# Patient Record
Sex: Male | Born: 1940 | Race: White | Hispanic: No | Marital: Married | Smoking: Former smoker
Health system: Southern US, Community
[De-identification: ages and names within clinical notes are randomized; demographics above are authoritative.]

## PROBLEM LIST (undated history)

## (undated) DIAGNOSIS — I2510 Atherosclerotic heart disease of native coronary artery without angina pectoris: Secondary | ICD-10-CM

## (undated) DIAGNOSIS — I499 Cardiac arrhythmia, unspecified: Secondary | ICD-10-CM

## (undated) DIAGNOSIS — E119 Type 2 diabetes mellitus without complications: Secondary | ICD-10-CM

## (undated) DIAGNOSIS — Z950 Presence of cardiac pacemaker: Secondary | ICD-10-CM

## (undated) DIAGNOSIS — C801 Malignant (primary) neoplasm, unspecified: Secondary | ICD-10-CM

## (undated) DIAGNOSIS — E785 Hyperlipidemia, unspecified: Secondary | ICD-10-CM

## (undated) DIAGNOSIS — I10 Essential (primary) hypertension: Secondary | ICD-10-CM

## (undated) HISTORY — PX: OTHER SURGICAL HISTORY: SHX169

## (undated) HISTORY — PX: TONSILLECTOMY: SUR1361

## (undated) HISTORY — PX: BACK SURGERY: SHX140

---

## 2011-06-12 DIAGNOSIS — E7800 Pure hypercholesterolemia, unspecified: Secondary | ICD-10-CM | POA: Diagnosis not present

## 2011-06-12 DIAGNOSIS — I10 Essential (primary) hypertension: Secondary | ICD-10-CM | POA: Diagnosis not present

## 2011-06-12 DIAGNOSIS — Z79899 Other long term (current) drug therapy: Secondary | ICD-10-CM | POA: Diagnosis not present

## 2011-06-12 DIAGNOSIS — E119 Type 2 diabetes mellitus without complications: Secondary | ICD-10-CM | POA: Diagnosis not present

## 2011-06-18 DIAGNOSIS — E7800 Pure hypercholesterolemia, unspecified: Secondary | ICD-10-CM | POA: Diagnosis not present

## 2011-06-18 DIAGNOSIS — I10 Essential (primary) hypertension: Secondary | ICD-10-CM | POA: Diagnosis not present

## 2011-06-18 DIAGNOSIS — I2510 Atherosclerotic heart disease of native coronary artery without angina pectoris: Secondary | ICD-10-CM | POA: Diagnosis not present

## 2011-06-18 DIAGNOSIS — E119 Type 2 diabetes mellitus without complications: Secondary | ICD-10-CM | POA: Diagnosis not present

## 2011-08-05 DIAGNOSIS — E119 Type 2 diabetes mellitus without complications: Secondary | ICD-10-CM | POA: Diagnosis not present

## 2011-08-05 DIAGNOSIS — H52229 Regular astigmatism, unspecified eye: Secondary | ICD-10-CM | POA: Diagnosis not present

## 2011-08-05 DIAGNOSIS — H2510 Age-related nuclear cataract, unspecified eye: Secondary | ICD-10-CM | POA: Diagnosis not present

## 2011-08-05 DIAGNOSIS — H5200 Hypermetropia, unspecified eye: Secondary | ICD-10-CM | POA: Diagnosis not present

## 2011-10-03 DIAGNOSIS — E7800 Pure hypercholesterolemia, unspecified: Secondary | ICD-10-CM | POA: Diagnosis not present

## 2011-10-03 DIAGNOSIS — E119 Type 2 diabetes mellitus without complications: Secondary | ICD-10-CM | POA: Diagnosis not present

## 2011-10-03 DIAGNOSIS — M109 Gout, unspecified: Secondary | ICD-10-CM | POA: Diagnosis not present

## 2011-10-10 DIAGNOSIS — E7800 Pure hypercholesterolemia, unspecified: Secondary | ICD-10-CM | POA: Diagnosis not present

## 2011-10-10 DIAGNOSIS — E119 Type 2 diabetes mellitus without complications: Secondary | ICD-10-CM | POA: Diagnosis not present

## 2011-10-10 DIAGNOSIS — I10 Essential (primary) hypertension: Secondary | ICD-10-CM | POA: Diagnosis not present

## 2011-10-20 DIAGNOSIS — D485 Neoplasm of uncertain behavior of skin: Secondary | ICD-10-CM | POA: Diagnosis not present

## 2012-02-05 DIAGNOSIS — E119 Type 2 diabetes mellitus without complications: Secondary | ICD-10-CM | POA: Diagnosis not present

## 2012-02-05 DIAGNOSIS — M109 Gout, unspecified: Secondary | ICD-10-CM | POA: Diagnosis not present

## 2012-02-05 DIAGNOSIS — Z125 Encounter for screening for malignant neoplasm of prostate: Secondary | ICD-10-CM | POA: Diagnosis not present

## 2012-02-05 DIAGNOSIS — E7800 Pure hypercholesterolemia, unspecified: Secondary | ICD-10-CM | POA: Diagnosis not present

## 2012-02-05 DIAGNOSIS — R5383 Other fatigue: Secondary | ICD-10-CM | POA: Diagnosis not present

## 2012-02-09 DIAGNOSIS — IMO0001 Reserved for inherently not codable concepts without codable children: Secondary | ICD-10-CM | POA: Diagnosis not present

## 2012-02-09 DIAGNOSIS — I10 Essential (primary) hypertension: Secondary | ICD-10-CM | POA: Diagnosis not present

## 2012-02-09 DIAGNOSIS — E7800 Pure hypercholesterolemia, unspecified: Secondary | ICD-10-CM | POA: Diagnosis not present

## 2012-02-09 DIAGNOSIS — Z23 Encounter for immunization: Secondary | ICD-10-CM | POA: Diagnosis not present

## 2012-06-11 DIAGNOSIS — E7800 Pure hypercholesterolemia, unspecified: Secondary | ICD-10-CM | POA: Diagnosis not present

## 2012-06-11 DIAGNOSIS — M109 Gout, unspecified: Secondary | ICD-10-CM | POA: Diagnosis not present

## 2012-06-11 DIAGNOSIS — IMO0001 Reserved for inherently not codable concepts without codable children: Secondary | ICD-10-CM | POA: Diagnosis not present

## 2012-06-14 DIAGNOSIS — E7800 Pure hypercholesterolemia, unspecified: Secondary | ICD-10-CM | POA: Diagnosis not present

## 2012-06-14 DIAGNOSIS — I10 Essential (primary) hypertension: Secondary | ICD-10-CM | POA: Diagnosis not present

## 2012-06-14 DIAGNOSIS — E119 Type 2 diabetes mellitus without complications: Secondary | ICD-10-CM | POA: Diagnosis not present

## 2012-07-15 DIAGNOSIS — E7800 Pure hypercholesterolemia, unspecified: Secondary | ICD-10-CM | POA: Diagnosis not present

## 2012-07-26 DIAGNOSIS — H9319 Tinnitus, unspecified ear: Secondary | ICD-10-CM | POA: Diagnosis not present

## 2012-07-26 DIAGNOSIS — H905 Unspecified sensorineural hearing loss: Secondary | ICD-10-CM | POA: Diagnosis not present

## 2012-08-13 DIAGNOSIS — H2510 Age-related nuclear cataract, unspecified eye: Secondary | ICD-10-CM | POA: Diagnosis not present

## 2012-08-13 DIAGNOSIS — H01009 Unspecified blepharitis unspecified eye, unspecified eyelid: Secondary | ICD-10-CM | POA: Diagnosis not present

## 2012-08-13 DIAGNOSIS — H25019 Cortical age-related cataract, unspecified eye: Secondary | ICD-10-CM | POA: Diagnosis not present

## 2012-08-13 DIAGNOSIS — E119 Type 2 diabetes mellitus without complications: Secondary | ICD-10-CM | POA: Diagnosis not present

## 2012-10-27 DIAGNOSIS — E119 Type 2 diabetes mellitus without complications: Secondary | ICD-10-CM | POA: Diagnosis not present

## 2012-10-27 DIAGNOSIS — M109 Gout, unspecified: Secondary | ICD-10-CM | POA: Diagnosis not present

## 2012-10-27 DIAGNOSIS — E7800 Pure hypercholesterolemia, unspecified: Secondary | ICD-10-CM | POA: Diagnosis not present

## 2012-10-29 DIAGNOSIS — E119 Type 2 diabetes mellitus without complications: Secondary | ICD-10-CM | POA: Diagnosis not present

## 2012-10-29 DIAGNOSIS — I10 Essential (primary) hypertension: Secondary | ICD-10-CM | POA: Diagnosis not present

## 2012-10-29 DIAGNOSIS — E7800 Pure hypercholesterolemia, unspecified: Secondary | ICD-10-CM | POA: Diagnosis not present

## 2012-10-29 DIAGNOSIS — M461 Sacroiliitis, not elsewhere classified: Secondary | ICD-10-CM | POA: Diagnosis not present

## 2012-12-28 DIAGNOSIS — M25559 Pain in unspecified hip: Secondary | ICD-10-CM | POA: Diagnosis not present

## 2013-01-17 DIAGNOSIS — M48061 Spinal stenosis, lumbar region without neurogenic claudication: Secondary | ICD-10-CM | POA: Diagnosis not present

## 2013-01-17 DIAGNOSIS — M25559 Pain in unspecified hip: Secondary | ICD-10-CM | POA: Diagnosis not present

## 2013-01-21 DIAGNOSIS — M25559 Pain in unspecified hip: Secondary | ICD-10-CM | POA: Diagnosis not present

## 2013-01-28 DIAGNOSIS — M5137 Other intervertebral disc degeneration, lumbosacral region: Secondary | ICD-10-CM | POA: Diagnosis not present

## 2013-03-02 DIAGNOSIS — E119 Type 2 diabetes mellitus without complications: Secondary | ICD-10-CM | POA: Diagnosis not present

## 2013-03-02 DIAGNOSIS — Z125 Encounter for screening for malignant neoplasm of prostate: Secondary | ICD-10-CM | POA: Diagnosis not present

## 2013-03-02 DIAGNOSIS — E7800 Pure hypercholesterolemia, unspecified: Secondary | ICD-10-CM | POA: Diagnosis not present

## 2013-03-02 DIAGNOSIS — M109 Gout, unspecified: Secondary | ICD-10-CM | POA: Diagnosis not present

## 2013-03-14 DIAGNOSIS — E7800 Pure hypercholesterolemia, unspecified: Secondary | ICD-10-CM | POA: Diagnosis not present

## 2013-03-14 DIAGNOSIS — I10 Essential (primary) hypertension: Secondary | ICD-10-CM | POA: Diagnosis not present

## 2013-03-14 DIAGNOSIS — Z23 Encounter for immunization: Secondary | ICD-10-CM | POA: Diagnosis not present

## 2013-03-14 DIAGNOSIS — E119 Type 2 diabetes mellitus without complications: Secondary | ICD-10-CM | POA: Diagnosis not present

## 2013-03-16 DIAGNOSIS — M5137 Other intervertebral disc degeneration, lumbosacral region: Secondary | ICD-10-CM | POA: Diagnosis not present

## 2013-03-22 DIAGNOSIS — M5137 Other intervertebral disc degeneration, lumbosacral region: Secondary | ICD-10-CM | POA: Diagnosis not present

## 2013-03-29 DIAGNOSIS — M5137 Other intervertebral disc degeneration, lumbosacral region: Secondary | ICD-10-CM | POA: Diagnosis not present

## 2013-06-27 DIAGNOSIS — M5137 Other intervertebral disc degeneration, lumbosacral region: Secondary | ICD-10-CM | POA: Diagnosis not present

## 2013-07-19 DIAGNOSIS — M51379 Other intervertebral disc degeneration, lumbosacral region without mention of lumbar back pain or lower extremity pain: Secondary | ICD-10-CM | POA: Diagnosis not present

## 2013-07-19 DIAGNOSIS — M47817 Spondylosis without myelopathy or radiculopathy, lumbosacral region: Secondary | ICD-10-CM | POA: Diagnosis not present

## 2013-07-19 DIAGNOSIS — M5137 Other intervertebral disc degeneration, lumbosacral region: Secondary | ICD-10-CM | POA: Diagnosis not present

## 2013-07-27 DIAGNOSIS — M109 Gout, unspecified: Secondary | ICD-10-CM | POA: Diagnosis not present

## 2013-07-27 DIAGNOSIS — E119 Type 2 diabetes mellitus without complications: Secondary | ICD-10-CM | POA: Diagnosis not present

## 2013-07-27 DIAGNOSIS — E7800 Pure hypercholesterolemia, unspecified: Secondary | ICD-10-CM | POA: Diagnosis not present

## 2013-07-27 DIAGNOSIS — N183 Chronic kidney disease, stage 3 unspecified: Secondary | ICD-10-CM | POA: Diagnosis not present

## 2013-07-29 DIAGNOSIS — E7800 Pure hypercholesterolemia, unspecified: Secondary | ICD-10-CM | POA: Diagnosis not present

## 2013-07-29 DIAGNOSIS — E119 Type 2 diabetes mellitus without complications: Secondary | ICD-10-CM | POA: Diagnosis not present

## 2013-07-29 DIAGNOSIS — Z1331 Encounter for screening for depression: Secondary | ICD-10-CM | POA: Diagnosis not present

## 2013-07-29 DIAGNOSIS — I10 Essential (primary) hypertension: Secondary | ICD-10-CM | POA: Diagnosis not present

## 2013-07-29 DIAGNOSIS — Z6830 Body mass index (BMI) 30.0-30.9, adult: Secondary | ICD-10-CM | POA: Diagnosis not present

## 2013-07-29 DIAGNOSIS — Z9181 History of falling: Secondary | ICD-10-CM | POA: Diagnosis not present

## 2013-08-02 DIAGNOSIS — M5137 Other intervertebral disc degeneration, lumbosacral region: Secondary | ICD-10-CM | POA: Diagnosis not present

## 2013-08-02 DIAGNOSIS — M48061 Spinal stenosis, lumbar region without neurogenic claudication: Secondary | ICD-10-CM | POA: Diagnosis not present

## 2013-08-03 DIAGNOSIS — I2510 Atherosclerotic heart disease of native coronary artery without angina pectoris: Secondary | ICD-10-CM | POA: Diagnosis not present

## 2013-08-03 DIAGNOSIS — E7800 Pure hypercholesterolemia, unspecified: Secondary | ICD-10-CM | POA: Diagnosis not present

## 2013-08-03 DIAGNOSIS — E119 Type 2 diabetes mellitus without complications: Secondary | ICD-10-CM | POA: Diagnosis not present

## 2013-08-03 DIAGNOSIS — I10 Essential (primary) hypertension: Secondary | ICD-10-CM | POA: Diagnosis not present

## 2013-08-30 DIAGNOSIS — M533 Sacrococcygeal disorders, not elsewhere classified: Secondary | ICD-10-CM | POA: Diagnosis not present

## 2013-09-12 DIAGNOSIS — H5200 Hypermetropia, unspecified eye: Secondary | ICD-10-CM | POA: Diagnosis not present

## 2013-09-12 DIAGNOSIS — H2510 Age-related nuclear cataract, unspecified eye: Secondary | ICD-10-CM | POA: Diagnosis not present

## 2013-09-12 DIAGNOSIS — E119 Type 2 diabetes mellitus without complications: Secondary | ICD-10-CM | POA: Diagnosis not present

## 2013-09-12 DIAGNOSIS — H01009 Unspecified blepharitis unspecified eye, unspecified eyelid: Secondary | ICD-10-CM | POA: Diagnosis not present

## 2013-09-12 DIAGNOSIS — H25019 Cortical age-related cataract, unspecified eye: Secondary | ICD-10-CM | POA: Diagnosis not present

## 2013-09-27 DIAGNOSIS — M48061 Spinal stenosis, lumbar region without neurogenic claudication: Secondary | ICD-10-CM | POA: Diagnosis not present

## 2013-09-27 DIAGNOSIS — M5137 Other intervertebral disc degeneration, lumbosacral region: Secondary | ICD-10-CM | POA: Diagnosis not present

## 2013-11-22 DIAGNOSIS — M109 Gout, unspecified: Secondary | ICD-10-CM | POA: Diagnosis not present

## 2013-11-22 DIAGNOSIS — E119 Type 2 diabetes mellitus without complications: Secondary | ICD-10-CM | POA: Diagnosis not present

## 2013-11-22 DIAGNOSIS — E7800 Pure hypercholesterolemia, unspecified: Secondary | ICD-10-CM | POA: Diagnosis not present

## 2013-11-22 DIAGNOSIS — N183 Chronic kidney disease, stage 3 unspecified: Secondary | ICD-10-CM | POA: Diagnosis not present

## 2013-11-25 DIAGNOSIS — H6120 Impacted cerumen, unspecified ear: Secondary | ICD-10-CM | POA: Diagnosis not present

## 2013-11-25 DIAGNOSIS — E7800 Pure hypercholesterolemia, unspecified: Secondary | ICD-10-CM | POA: Diagnosis not present

## 2013-11-25 DIAGNOSIS — N183 Chronic kidney disease, stage 3 unspecified: Secondary | ICD-10-CM | POA: Diagnosis not present

## 2013-11-25 DIAGNOSIS — I10 Essential (primary) hypertension: Secondary | ICD-10-CM | POA: Diagnosis not present

## 2013-11-25 DIAGNOSIS — E119 Type 2 diabetes mellitus without complications: Secondary | ICD-10-CM | POA: Diagnosis not present

## 2013-11-25 DIAGNOSIS — M109 Gout, unspecified: Secondary | ICD-10-CM | POA: Diagnosis not present

## 2013-11-25 DIAGNOSIS — Z23 Encounter for immunization: Secondary | ICD-10-CM | POA: Diagnosis not present

## 2013-11-25 DIAGNOSIS — Z0000 Encounter for general adult medical examination without abnormal findings: Secondary | ICD-10-CM | POA: Diagnosis not present

## 2013-12-02 DIAGNOSIS — M25559 Pain in unspecified hip: Secondary | ICD-10-CM | POA: Diagnosis not present

## 2013-12-02 DIAGNOSIS — M5137 Other intervertebral disc degeneration, lumbosacral region: Secondary | ICD-10-CM | POA: Diagnosis not present

## 2013-12-02 DIAGNOSIS — M48061 Spinal stenosis, lumbar region without neurogenic claudication: Secondary | ICD-10-CM | POA: Diagnosis not present

## 2014-02-01 DIAGNOSIS — IMO0002 Reserved for concepts with insufficient information to code with codable children: Secondary | ICD-10-CM | POA: Diagnosis not present

## 2014-02-01 DIAGNOSIS — M5137 Other intervertebral disc degeneration, lumbosacral region: Secondary | ICD-10-CM | POA: Diagnosis not present

## 2014-02-01 DIAGNOSIS — M47817 Spondylosis without myelopathy or radiculopathy, lumbosacral region: Secondary | ICD-10-CM | POA: Diagnosis not present

## 2014-02-01 DIAGNOSIS — M545 Low back pain, unspecified: Secondary | ICD-10-CM | POA: Diagnosis not present

## 2014-02-01 DIAGNOSIS — E663 Overweight: Secondary | ICD-10-CM | POA: Diagnosis not present

## 2014-02-21 DIAGNOSIS — M5416 Radiculopathy, lumbar region: Secondary | ICD-10-CM | POA: Diagnosis not present

## 2014-03-13 DIAGNOSIS — M5416 Radiculopathy, lumbar region: Secondary | ICD-10-CM | POA: Diagnosis not present

## 2014-03-28 DIAGNOSIS — M109 Gout, unspecified: Secondary | ICD-10-CM | POA: Diagnosis not present

## 2014-03-28 DIAGNOSIS — E119 Type 2 diabetes mellitus without complications: Secondary | ICD-10-CM | POA: Diagnosis not present

## 2014-03-28 DIAGNOSIS — E780 Pure hypercholesterolemia: Secondary | ICD-10-CM | POA: Diagnosis not present

## 2014-03-28 DIAGNOSIS — Z125 Encounter for screening for malignant neoplasm of prostate: Secondary | ICD-10-CM | POA: Diagnosis not present

## 2014-03-28 DIAGNOSIS — N183 Chronic kidney disease, stage 3 (moderate): Secondary | ICD-10-CM | POA: Diagnosis not present

## 2014-03-29 DIAGNOSIS — L570 Actinic keratosis: Secondary | ICD-10-CM | POA: Diagnosis not present

## 2014-03-29 DIAGNOSIS — D1801 Hemangioma of skin and subcutaneous tissue: Secondary | ICD-10-CM | POA: Diagnosis not present

## 2014-03-29 DIAGNOSIS — C4361 Malignant melanoma of right upper limb, including shoulder: Secondary | ICD-10-CM | POA: Diagnosis not present

## 2014-03-29 DIAGNOSIS — D485 Neoplasm of uncertain behavior of skin: Secondary | ICD-10-CM | POA: Diagnosis not present

## 2014-03-29 DIAGNOSIS — C4359 Malignant melanoma of other part of trunk: Secondary | ICD-10-CM | POA: Diagnosis not present

## 2014-03-29 DIAGNOSIS — D225 Melanocytic nevi of trunk: Secondary | ICD-10-CM | POA: Diagnosis not present

## 2014-03-29 DIAGNOSIS — L814 Other melanin hyperpigmentation: Secondary | ICD-10-CM | POA: Diagnosis not present

## 2014-03-31 DIAGNOSIS — E780 Pure hypercholesterolemia: Secondary | ICD-10-CM | POA: Diagnosis not present

## 2014-03-31 DIAGNOSIS — I10 Essential (primary) hypertension: Secondary | ICD-10-CM | POA: Diagnosis not present

## 2014-03-31 DIAGNOSIS — N183 Chronic kidney disease, stage 3 (moderate): Secondary | ICD-10-CM | POA: Diagnosis not present

## 2014-03-31 DIAGNOSIS — Z23 Encounter for immunization: Secondary | ICD-10-CM | POA: Diagnosis not present

## 2014-03-31 DIAGNOSIS — M109 Gout, unspecified: Secondary | ICD-10-CM | POA: Diagnosis not present

## 2014-03-31 DIAGNOSIS — E119 Type 2 diabetes mellitus without complications: Secondary | ICD-10-CM | POA: Diagnosis not present

## 2014-04-17 DIAGNOSIS — D0361 Melanoma in situ of right upper limb, including shoulder: Secondary | ICD-10-CM | POA: Diagnosis not present

## 2014-04-17 DIAGNOSIS — Z85820 Personal history of malignant melanoma of skin: Secondary | ICD-10-CM | POA: Diagnosis not present

## 2014-04-17 DIAGNOSIS — C4359 Malignant melanoma of other part of trunk: Secondary | ICD-10-CM | POA: Diagnosis not present

## 2014-04-20 DIAGNOSIS — M5136 Other intervertebral disc degeneration, lumbar region: Secondary | ICD-10-CM | POA: Diagnosis not present

## 2014-04-20 DIAGNOSIS — M4716 Other spondylosis with myelopathy, lumbar region: Secondary | ICD-10-CM | POA: Diagnosis not present

## 2014-04-21 ENCOUNTER — Encounter (HOSPITAL_COMMUNITY): Payer: Self-pay | Admitting: Emergency Medicine

## 2014-04-21 ENCOUNTER — Emergency Department (HOSPITAL_COMMUNITY): Payer: Medicare Other

## 2014-04-21 ENCOUNTER — Inpatient Hospital Stay (HOSPITAL_COMMUNITY)
Admission: EM | Admit: 2014-04-21 | Discharge: 2014-04-24 | DRG: 315 | Disposition: A | Discharge status: Home / Self Care | Payer: Medicare Other | Attending: Internal Medicine | Admitting: Internal Medicine

## 2014-04-21 DIAGNOSIS — I10 Essential (primary) hypertension: Secondary | ICD-10-CM | POA: Diagnosis present

## 2014-04-21 DIAGNOSIS — Z9861 Coronary angioplasty status: Secondary | ICD-10-CM | POA: Diagnosis not present

## 2014-04-21 DIAGNOSIS — M546 Pain in thoracic spine: Secondary | ICD-10-CM | POA: Diagnosis not present

## 2014-04-21 DIAGNOSIS — N179 Acute kidney failure, unspecified: Secondary | ICD-10-CM | POA: Diagnosis present

## 2014-04-21 DIAGNOSIS — I2510 Atherosclerotic heart disease of native coronary artery without angina pectoris: Secondary | ICD-10-CM | POA: Diagnosis present

## 2014-04-21 DIAGNOSIS — R9431 Abnormal electrocardiogram [ECG] [EKG]: Secondary | ICD-10-CM | POA: Diagnosis not present

## 2014-04-21 DIAGNOSIS — I4510 Unspecified right bundle-branch block: Secondary | ICD-10-CM | POA: Diagnosis present

## 2014-04-21 DIAGNOSIS — E119 Type 2 diabetes mellitus without complications: Secondary | ICD-10-CM

## 2014-04-21 DIAGNOSIS — I517 Cardiomegaly: Secondary | ICD-10-CM | POA: Diagnosis present

## 2014-04-21 DIAGNOSIS — J9811 Atelectasis: Secondary | ICD-10-CM | POA: Diagnosis present

## 2014-04-21 DIAGNOSIS — R079 Chest pain, unspecified: Secondary | ICD-10-CM | POA: Insufficient documentation

## 2014-04-21 DIAGNOSIS — R072 Precordial pain: Secondary | ICD-10-CM

## 2014-04-21 DIAGNOSIS — I319 Disease of pericardium, unspecified: Principal | ICD-10-CM | POA: Diagnosis present

## 2014-04-21 DIAGNOSIS — E785 Hyperlipidemia, unspecified: Secondary | ICD-10-CM | POA: Diagnosis not present

## 2014-04-21 DIAGNOSIS — I484 Atypical atrial flutter: Secondary | ICD-10-CM | POA: Diagnosis not present

## 2014-04-21 DIAGNOSIS — I4892 Unspecified atrial flutter: Secondary | ICD-10-CM | POA: Diagnosis not present

## 2014-04-21 DIAGNOSIS — Z7982 Long term (current) use of aspirin: Secondary | ICD-10-CM

## 2014-04-21 DIAGNOSIS — I483 Typical atrial flutter: Secondary | ICD-10-CM | POA: Diagnosis not present

## 2014-04-21 DIAGNOSIS — R52 Pain, unspecified: Secondary | ICD-10-CM

## 2014-04-21 DIAGNOSIS — R0789 Other chest pain: Secondary | ICD-10-CM | POA: Diagnosis not present

## 2014-04-21 DIAGNOSIS — Z87891 Personal history of nicotine dependence: Secondary | ICD-10-CM

## 2014-04-21 DIAGNOSIS — M549 Dorsalgia, unspecified: Secondary | ICD-10-CM

## 2014-04-21 HISTORY — DX: Essential (primary) hypertension: I10

## 2014-04-21 HISTORY — DX: Atherosclerotic heart disease of native coronary artery without angina pectoris: I25.10

## 2014-04-21 HISTORY — DX: Hyperlipidemia, unspecified: E78.5

## 2014-04-21 HISTORY — DX: Type 2 diabetes mellitus without complications: E11.9

## 2014-04-21 LAB — LIPID PANEL
Cholesterol: 129 mg/dL (ref 0–200)
HDL: 65 mg/dL (ref >39)
LDL CALC: 56 mg/dL (ref 0–99)
TRIGLYCERIDES: 41 mg/dL (ref <150)
Total CHOL/HDL Ratio: 2 RATIO
VLDL: 8 mg/dL (ref 0–40)

## 2014-04-21 LAB — RAPID URINE DRUG SCREEN, HOSP PERFORMED
Amphetamines: NOT DETECTED
Barbiturates: NOT DETECTED
Benzodiazepines: NOT DETECTED
COCAINE: NOT DETECTED
OPIATES: POSITIVE — AB
Tetrahydrocannabinol: NOT DETECTED

## 2014-04-21 LAB — URINALYSIS, ROUTINE W REFLEX MICROSCOPIC
Bilirubin Urine: NEGATIVE
Glucose, UA: NEGATIVE mg/dL
Hgb urine dipstick: NEGATIVE
Ketones, ur: NEGATIVE mg/dL
Leukocytes, UA: NEGATIVE
NITRITE: NEGATIVE
Protein, ur: NEGATIVE mg/dL
SPECIFIC GRAVITY, URINE: 1.044 — AB (ref 1.005–1.030)
UROBILINOGEN UA: 1 mg/dL (ref 0.0–1.0)
pH: 5 (ref 5.0–8.0)

## 2014-04-21 LAB — BASIC METABOLIC PANEL
ANION GAP: 14 (ref 5–15)
BUN: 26 mg/dL — ABNORMAL HIGH (ref 6–23)
CO2: 24 meq/L (ref 19–32)
CREATININE: 1.09 mg/dL (ref 0.50–1.35)
Calcium: 9.2 mg/dL (ref 8.4–10.5)
Chloride: 99 mEq/L (ref 96–112)
GFR calc Af Amer: 76 mL/min — ABNORMAL LOW (ref >90)
GFR, EST NON AFRICAN AMERICAN: 65 mL/min — AB (ref >90)
Glucose, Bld: 214 mg/dL — ABNORMAL HIGH (ref 70–99)
Potassium: 4.6 mEq/L (ref 3.7–5.3)
Sodium: 137 mEq/L (ref 137–147)

## 2014-04-21 LAB — HEPATIC FUNCTION PANEL
ALT: 13 U/L (ref 0–53)
AST: 14 U/L (ref 0–37)
Albumin: 3.6 g/dL (ref 3.5–5.2)
Alkaline Phosphatase: 53 U/L (ref 39–117)
BILIRUBIN DIRECT: 0.4 mg/dL — AB (ref 0.0–0.3)
BILIRUBIN TOTAL: 1.2 mg/dL (ref 0.3–1.2)
Indirect Bilirubin: 0.8 mg/dL (ref 0.3–0.9)
Total Protein: 6.6 g/dL (ref 6.0–8.3)

## 2014-04-21 LAB — CBC
HCT: 36.1 % — ABNORMAL LOW (ref 39.0–52.0)
Hemoglobin: 11.7 g/dL — ABNORMAL LOW (ref 13.0–17.0)
MCH: 28.8 pg (ref 26.0–34.0)
MCHC: 32.4 g/dL (ref 30.0–36.0)
MCV: 88.9 fL (ref 78.0–100.0)
PLATELETS: 164 10*3/uL (ref 150–400)
RBC: 4.06 MIL/uL — AB (ref 4.22–5.81)
RDW: 15 % (ref 11.5–15.5)
WBC: 7.8 10*3/uL (ref 4.0–10.5)

## 2014-04-21 LAB — MRSA PCR SCREENING: MRSA by PCR: NEGATIVE

## 2014-04-21 LAB — PRO B NATRIURETIC PEPTIDE: PRO B NATRI PEPTIDE: 213.2 pg/mL — AB (ref 0–125)

## 2014-04-21 LAB — D-DIMER, QUANTITATIVE: D-Dimer, Quant: 0.74 ug/mL-FEU — ABNORMAL HIGH (ref 0.00–0.48)

## 2014-04-21 LAB — I-STAT TROPONIN, ED: Troponin i, poc: 0 ng/mL (ref 0.00–0.08)

## 2014-04-21 LAB — TROPONIN I
Troponin I: <0.3 ng/mL (ref <0.30)
Troponin I: <0.3 ng/mL (ref <0.30)
Troponin I: <0.3 ng/mL (ref <0.30)

## 2014-04-21 LAB — HEMOGLOBIN A1C
Hgb A1c MFr Bld: 6.9 % — ABNORMAL HIGH (ref <5.7)
MEAN PLASMA GLUCOSE: 151 mg/dL — AB (ref <117)

## 2014-04-21 LAB — GLUCOSE, CAPILLARY
Glucose-Capillary: 298 mg/dL — ABNORMAL HIGH (ref 70–99)
Glucose-Capillary: 238 mg/dL — ABNORMAL HIGH (ref 70–99)

## 2014-04-21 LAB — MAGNESIUM: Magnesium: 1.4 mg/dL — ABNORMAL LOW (ref 1.5–2.5)

## 2014-04-21 MED ORDER — SODIUM CHLORIDE 0.9 % IJ SOLN
3.0000 mL | Freq: Two times a day (BID) | INTRAMUSCULAR | Status: DC
  Administered 2014-04-22 – 2014-04-24 (×3): 3 mL via INTRAVENOUS

## 2014-04-21 MED ORDER — HYDROMORPHONE HCL 1 MG/ML IJ SOLN
1.0000 mg | Freq: Once | INTRAMUSCULAR | Status: AC
  Administered 2014-04-21: 1 mg via INTRAVENOUS
  Filled 2014-04-21: qty 1

## 2014-04-21 MED ORDER — ENOXAPARIN SODIUM 40 MG/0.4ML ~~LOC~~ SOLN
40.0000 mg | SUBCUTANEOUS | Status: DC
  Administered 2014-04-21 – 2014-04-22 (×2): 40 mg via SUBCUTANEOUS
  Filled 2014-04-21 (×3): qty 0.4

## 2014-04-21 MED ORDER — CARVEDILOL 6.25 MG PO TABS
6.2500 mg | ORAL_TABLET | Freq: Two times a day (BID) | ORAL | Status: DC
  Administered 2014-04-21 – 2014-04-23 (×4): 6.25 mg via ORAL
  Filled 2014-04-21 (×4): qty 1

## 2014-04-21 MED ORDER — NITROGLYCERIN 0.4 MG SL SUBL: 0.4000 mg | SUBLINGUAL_TABLET | SUBLINGUAL | Status: DC | PRN

## 2014-04-21 MED ORDER — MORPHINE SULFATE 2 MG/ML IJ SOLN
1.0000 mg | INTRAMUSCULAR | Status: DC | PRN
Start: 2014-04-21 — End: 2014-04-24
  Administered 2014-04-21: 1 mg via INTRAVENOUS
  Filled 2014-04-21: qty 1

## 2014-04-21 MED ORDER — IOHEXOL 350 MG/ML SOLN
80.0000 mL | Freq: Once | INTRAVENOUS | Status: AC | PRN
  Administered 2014-04-21: 59 mL via INTRAVENOUS

## 2014-04-21 MED ORDER — INSULIN ASPART 100 UNIT/ML ~~LOC~~ SOLN
0.0000 [IU] | Freq: Three times a day (TID) | SUBCUTANEOUS | Status: DC
  Administered 2014-04-21 – 2014-04-23 (×3): 5 [IU] via SUBCUTANEOUS
  Administered 2014-04-23: 2 [IU] via SUBCUTANEOUS
  Administered 2014-04-23: 3 [IU] via SUBCUTANEOUS
  Administered 2014-04-24: 2 [IU] via SUBCUTANEOUS

## 2014-04-21 MED ORDER — OXYCODONE-ACETAMINOPHEN 5-325 MG PO TABS
1.0000 | ORAL_TABLET | ORAL | Status: DC | PRN
  Administered 2014-04-21 – 2014-04-22 (×3): 1 via ORAL
  Filled 2014-04-21 (×3): qty 1

## 2014-04-21 MED ORDER — ACETAMINOPHEN 325 MG PO TABS: 650.0000 mg | ORAL_TABLET | Freq: Four times a day (QID) | ORAL | Status: DC | PRN

## 2014-04-21 MED ORDER — OXYCODONE HCL 5 MG PO TABS
5.0000 mg | ORAL_TABLET | ORAL | Status: DC | PRN
  Administered 2014-04-21 (×2): 5 mg via ORAL
  Filled 2014-04-21 (×2): qty 1

## 2014-04-21 MED ORDER — SIMVASTATIN 40 MG PO TABS
40.0000 mg | ORAL_TABLET | Freq: Every day | ORAL | Status: DC
  Administered 2014-04-21 – 2014-04-23 (×3): 40 mg via ORAL
  Filled 2014-04-21 (×2): qty 1
  Filled 2014-04-21: qty 2

## 2014-04-21 MED ORDER — MORPHINE SULFATE 4 MG/ML IJ SOLN
4.0000 mg | Freq: Once | INTRAMUSCULAR | Status: AC
  Administered 2014-04-21: 4 mg via INTRAVENOUS
  Filled 2014-04-21: qty 1

## 2014-04-21 MED ORDER — OXYCODONE HCL 5 MG PO TABS
5.0000 mg | ORAL_TABLET | ORAL | Status: DC | PRN
Start: 2014-04-21 — End: 2014-04-21

## 2014-04-21 MED ORDER — ASPIRIN EC 81 MG PO TBEC
81.0000 mg | DELAYED_RELEASE_TABLET | Freq: Every day | ORAL | Status: DC
  Administered 2014-04-22 – 2014-04-24 (×3): 81 mg via ORAL
  Filled 2014-04-21 (×4): qty 1

## 2014-04-21 NOTE — ED Notes (Signed)
EMS - Patient coming from home with left sided chest pressure that started this morning around 04:00am.  Patient took 324mg  Aspirin, given 2 nitro with no relief.  Denies SOB, nausea, vomiting.  States the pain is left sided and has "a lot of left shoulder and back pain".  1 stent placed 13 years ago.  132/58, 80 pulse, 14 respirations, 96% room air and 187 CBG.

## 2014-04-21 NOTE — ED Provider Notes (Signed)
CSN: [id number]     Arrival date & time 04/21/14  0609 History   None    Chief Complaint  Patient presents with  . Chest Pain     (Consider location/radiation/quality/duration/timing/severity/associated sxs/prior Treatment) Patient is a 73 y.o. male presenting with chest pain. The history is provided by the patient. No language interpreter was used.  Chest Pain Pain location:  L chest Pain radiates to:  Mid back Pain radiates to the back: yes   Pain severity:  Moderate Onset quality:  Gradual Timing:  Intermittent Progression:  Unchanged Chronicity:  New Context: not breathing   Relieved by:  Nothing Worsened by:  Nothing tried Associated symptoms: back pain   Associated symptoms: no abdominal pain and no nausea     History reviewed. No pertinent past medical history. No past surgical history on file. No family history on file. History  Substance Use Topics  . Smoking status: Not on file  . Smokeless tobacco: Not on file  . Alcohol Use: Not on file    Review of Systems  Cardiovascular: Positive for chest pain.  Gastrointestinal: Negative for nausea and abdominal pain.  Musculoskeletal: Positive for back pain.  All other systems reviewed and are negative.     Allergies  Review of patient's allergies indicates no known allergies.  Home Medications   Prior to Admission medications   Not on File   BP 144/64 mmHg  Pulse 79  Temp(Src) 98.6 F (37 C) (Oral)  Resp 18  Ht 5' 10.5" (1.791 m)  Wt 220 lb (99.791 kg)  BMI 31.11 kg/m2  SpO2 96% Physical Exam  Constitutional: He is oriented to person, place, and time. He appears well-developed and well-nourished.  HENT:  Head: Normocephalic and atraumatic.  Right Ear: External ear normal.  Left Ear: External ear normal.  Eyes: EOM are normal. Pupils are equal, round, and reactive to light.  Neck: Normal range of motion.  Cardiovascular: Normal rate and regular rhythm.   Pulmonary/Chest: Effort normal and  breath sounds normal.  Abdominal: Soft. He exhibits no distension.  Musculoskeletal: Normal range of motion.  Neurological: He is alert and oriented to person, place, and time.  Skin: Skin is warm.  Psychiatric: He has a normal mood and affect.  Nursing note and vitals reviewed.   ED Course  Procedures (including critical care time) Labs Review Labs Reviewed  CBC - Abnormal; Notable for the following:    RBC 4.06 (*)    Hemoglobin 11.7 (*)    HCT 36.1 (*)    All other components within normal limits  BASIC METABOLIC PANEL - Abnormal; Notable for the following:    Glucose, Bld 214 (*)    BUN 26 (*)    GFR calc non Af Amer 65 (*)    GFR calc Af Amer 76 (*)    All other components within normal limits  PRO B NATRIURETIC PEPTIDE - Abnormal; Notable for the following:    Pro B Natriuretic peptide (BNP) 213.2 (*)    All other components within normal limits  D-DIMER, QUANTITATIVE - Abnormal; Notable for the following:    D-Dimer, Quant 0.74 (*)    All other components within normal limits  TROPONIN I  Randolm Idol, ED    Imaging Review Dg Chest 2 View  04/21/2014   CLINICAL DATA:  73 year old male awoke with left upper chest pain. Initial encounter.  EXAM: CHEST  2 VIEW  COMPARISON:  None.  FINDINGS: Mildly low lung volumes. Normal cardiac size and mediastinal  contours. Visualized tracheal air column is within normal limits. No pneumothorax, pulmonary edema, pleural effusion or confluent pulmonary opacity. No acute osseous abnormality identified.  IMPRESSION: No acute cardiopulmonary abnormality.   Electronically Signed   By: Lars Pinks M.D.   On: 04/21/2014 07:30   Ct Angio Chest Pe W/cm &/or Wo Cm  04/21/2014   CLINICAL DATA:  73 year old male with acute left chest pain beginning at 0400 hrs. Pain radiating to the left shoulder and back. 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 Initial encounter.  EXAM: CT ANGIOGRAPHY CHEST WITH CONTRAST  TECHNIQUE: Multidetector CT  imaging of the chest was performed using the standard protocol during bolus administration of intravenous contrast. Multiplanar CT image reconstructions and MIPs were obtained to evaluate the vascular anatomy.  CONTRAST:  72mL OMNIPAQUE IOHEXOL 350 MG/ML SOLN  COMPARISON:  Chest radiographs 0705 hr today.  FINDINGS: Adequate contrast bolus timing in the pulmonary arterial tree. Respiratory motion, especially at the lung bases. No focal filling defect identified in the pulmonary arterial tree to suggest the presence of acute pulmonary embolism.  Major airways are patent, except for some atelectatic changes. Mild linear in dependent opacity in both lung bases most resembles atelectasis.  Mediastinal lipomatosis. Mild to moderate cardiomegaly. No pericardial or pleural effusion. Widespread calcified atherosclerosis of the aorta and its branches, including coronary artery calcified plaque. No thoracic aortic dissection or aneurysm. No mediastinal or hilar lymphadenopathy. No axillary lymphadenopathy. Negative thoracic inlet.  Negative visualized liver, gallbladder, spleen, pancreas, adrenal glands, and bowel in the upper abdomen.  Osteopenia. No acute osseous abnormality identified.  Review of the MIP images confirms the above findings.  IMPRESSION: 1. No evidence of acute pulmonary embolus. 2. No thoracic aortic dissection or aneurysm. Calcified plaque, including calcified coronary artery plaque. 3. Pulmonary atelectasis.  Mild to moderate cardiomegaly.   Electronically Signed   By: Lars Pinks M.D.   On: 04/21/2014 09:29     EKG Interpretation   Date/Time:  Friday April 21 2014 06:21:10 EST Ventricular Rate:  79 PR Interval:  139 QRS Duration: 103 QT Interval:  408 QTC Calculation: 468 R Axis:   -52 Text Interpretation:  Sinus rhythm Incomplete RBBB and LAFB Abnormal  R-wave progression, late transition No old tracing to compare Confirmed by  GOLDSTON  MD, SCOTT ([id number]) on 04/21/2014 8:00:29 AM       MDM   Final diagnoses:  Pain  Pain in back    I spoke to unassigned Medical Service Dr. Tami Ribas.   Pt will be admitted for observation    Fransico Meadow, PA-C 04/21/14 [place], [place] 04/21/14 [place], MD 04/21/14 [phone]

## 2014-04-21 NOTE — H&P (Signed)
Date: 04/21/2014               Patient Name:  Jamie Howard MRN: [id number]  DOB: 1940-07-23 Age / Sex: 73 y.o., male   PCP: Leonides Sake, MD         Medical Service: Internal Medicine Teaching Service         Attending Physician: Dr. Madilyn Fireman, MD    First Contact: Dr.Krall  Pager: [phone]  Second Contact: Dr. Redmond Pulling Pager: [phone]       After Hours (After 5p/  First Contact Pager: [phone]  weekends / holidays): Second Contact Pager: [phone]   Chief Complaint: Chest pain   History of Present Illness:  73 y.o PMH DM, HTN, CAD s/p stent (no cath in the last 13 years).  He presents with left chest pain radiating to his back (left shoulder blade)  without relief with NTG x 2.  Chest pain woke him up at 4 am and felt inward "pressure-like" and like "broken sharp bones sticking into each other".  Nothing made it worse (except for deep breathing) and nothing makes it better. Pain was constant since onset.   Pain was 6-7/10 initially and now 4-5/10 and still present though given Aspirin 324 mg, Tylenol 650 mg x 1, Dilaudid 1 mg x 1, Morphine 4 mg x 1, and NTG in the ED.  He denies nausea/vomiting, sob, sweating, radiation to left arm.  He denies heart burn or anxiety.  This chest pain is a different feeling than 13 years ago when he had a stent.  Now he reports a "crampy" sensation in his neck.  D-dimer was slightly elevated in the ED.  CT chest was done which was negative for PE, aortic dissection but had atelectasis and mild to moderate cardiomegaly.    Cardiologist-Dr. Atilano Median [place] PCP-[facility]  Meds: Current Facility-Administered Medications  Medication Dose Route Frequency Provider Last Rate Last Dose  . acetaminophen (TYLENOL) tablet 650 mg  650 mg Oral Q6H PRN Fransico Meadow, PA-C      . morphine 2 MG/ML injection 1 mg  1 mg Intravenous Q4H PRN Cresenciano Genre, MD   1 mg at 04/21/14 1227  . nitroGLYCERIN (NITROSTAT) SL tablet 0.4 mg  0.4 mg Sublingual Q5 min  PRN Fransico Meadow, PA-C       Medications Prior to Admission  Medication Sig Dispense Refill  . aspirin EC 81 MG tablet Take 81 mg by mouth daily.    . carvedilol (COREG) 25 MG tablet Take 6.25 mg by mouth 2 (two) times daily with a meal.     . colchicine 0.6 MG tablet Take 0.6 mg by mouth daily as needed (gout).    Marland Kitchen doxazosin (CARDURA) 8 MG tablet Take 16 mg by mouth at bedtime.    Marland Kitchen losartan-hydrochlorothiazide (HYZAAR) 100-25 MG per tablet Take 1 tablet by mouth daily.    . metFORMIN (GLUCOPHAGE-XR) 500 MG 24 hr tablet Take 1,000 mg by mouth 2 (two) times daily.    . pioglitazone (ACTOS) 30 MG tablet Take 30 mg by mouth daily.    . simvastatin (ZOCOR) 40 MG tablet Take 40 mg by mouth daily.     Allergies: Allergies as of 04/21/2014  . (No Known Allergies)   Past Medical History  Diagnosis Date  . Diabetes mellitus without complication   . Hypertension   . Low back pain    Past Surgical History  Procedure Laterality Date  . Back surgery  ruptured disk 1970s-1980s  . Other surgical history      stent   Family History  Problem Relation Age of Onset  . Heart attack      dad at 88  . Diabetes      younger brother died complications of DM  . Diabetes      brother 2, DM 2   . Diabetes      aunt type 1  . Leukemia      aunt   History   Social History  . Marital Status: Unknown    Spouse Name: N/A    Number of Children: N/A  . Years of Education: N/A   Occupational History  . Not on file.   Social History Main Topics  . Smoking status: Former Research scientist (life sciences)  . Smokeless tobacco: Not on file  . Alcohol Use: Yes     Comment: occassional  . Drug Use: No  . Sexual Activity: Not on file   Other Topics Concern  . 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     Physical Exam: BP 121/45 (63) 26 78 96% RA  Blood pressure 130/53, pulse 77, temperature 98.6 F (37 C), temperature source Oral, resp. rate 21, height 5' 10.5" (1.791 m), weight 220 lb (99.791 kg), SpO2 96 %. Vitals reviewed. General: resting in bed, NAD HEENT: PERRL b/l, [place]/at, no scleral icterus Cardiac: RRR, no rubs, murmurs or gallops, no chest wall ttp Pulm: clear to auscultation bilaterally, no wheezes, rales, or rhonchi Abd: soft, obese nontender, nondistended, BS present Ext: warm and well perfused, 1+ pedal edema Neuro: alert and oriented X3, cranial nerves II-XII grossly intact, moving all 4 extremities  MSK: mild left shoulder blade ttp   Lab results: Basic Metabolic Panel:  Recent Labs  04/21/14 0635  NA 137  K 4.6  CL 99  CO2 24  GLUCOSE 214*  BUN 26*  CREATININE 1.09  CALCIUM 9.2   Liver Function Tests: No results for input(s): AST, ALT, ALKPHOS, BILITOT, PROT, ALBUMIN in the last 72 hours. No results for input(s): LIPASE, AMYLASE in the last 72 hours. No results for input(s): AMMONIA in the last 72 hours. CBC:  Recent Labs  04/21/14 0635  WBC 7.8  HGB 11.7*  HCT 36.1*  MCV 88.9  PLT 164   Cardiac Enzymes:  Recent Labs  04/21/14 0635  TROPONINI <0.30   BNP:  Recent Labs  04/21/14 0635  PROBNP 213.2*   D-Dimer:  Recent Labs  04/21/14 0635  DDIMER 0.74*   Hemoglobin A1C: No results for input(s): HGBA1C in the last 72 hours. Fasting Lipid Panel: No results for input(s): CHOL, HDL, LDLCALC, TRIG, CHOLHDL, LDLDIRECT in the last 72 hours.  Urine Drug Screen: Drugs of Abuse  No results found for: LABOPIA, COCAINSCRNUR, LABBENZ, AMPHETMU, THCU, LABBARB  Urinalysis: No results for input(s): COLORURINE, LABSPEC,  PHURINE, GLUCOSEU, HGBUR, BILIRUBINUR, KETONESUR, PROTEINUR, UROBILINOGEN, NITRITE, LEUKOCYTESUR in the last 72 hours.  Invalid input(s): APPERANCEUR Misc. Labs: Trop x 2 Mag Lfts  UA UDS Lipid HA1C  Imaging results:  Dg  Chest 2 View  04/21/2014   CLINICAL DATA:  73 year old male awoke with left upper chest pain. Initial encounter.  EXAM: CHEST  2 VIEW  COMPARISON:  None.  FINDINGS: Mildly low lung volumes. Normal cardiac size and mediastinal contours. Visualized tracheal air column is within normal limits. No pneumothorax, pulmonary edema, pleural effusion or confluent pulmonary opacity. No acute osseous abnormality identified.  IMPRESSION: No acute cardiopulmonary abnormality.   Electronically Signed   By: Lars Pinks M.D.   On: 04/21/2014 07:30   Ct Angio Chest Pe W/cm &/or Wo Cm  04/21/2014   CLINICAL DATA:  73 year old male with acute left chest pain beginning at 0400 hrs. Pain radiating to the left shoulder and back. 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 Initial encounter.  EXAM: CT ANGIOGRAPHY CHEST WITH CONTRAST  TECHNIQUE: Multidetector CT imaging of the chest was performed using the standard protocol during bolus administration of intravenous contrast. Multiplanar CT image reconstructions and MIPs were obtained to evaluate the vascular anatomy.  CONTRAST:  2mL OMNIPAQUE IOHEXOL 350 MG/ML SOLN  COMPARISON:  Chest radiographs 0705 hr today.  FINDINGS: Adequate contrast bolus timing in the pulmonary arterial tree. Respiratory motion, especially at the lung bases. No focal filling defect identified in the pulmonary arterial tree to suggest the presence of acute pulmonary embolism.  Major airways are patent, except for some atelectatic changes. Mild linear in dependent opacity in both lung bases most resembles atelectasis.  Mediastinal lipomatosis. Mild to moderate cardiomegaly. No pericardial or pleural effusion. Widespread calcified atherosclerosis of the aorta and its branches,  including coronary artery calcified plaque. No thoracic aortic dissection or aneurysm. No mediastinal or hilar lymphadenopathy. No axillary lymphadenopathy. Negative thoracic inlet.  Negative visualized liver, gallbladder, spleen, pancreas, adrenal glands, and bowel in the upper abdomen.  Osteopenia. No acute osseous abnormality identified.  Review of the MIP images confirms the above findings.  IMPRESSION: 1. No evidence of acute pulmonary embolus. 2. No thoracic aortic dissection or aneurysm. Calcified plaque, including calcified coronary artery plaque. 3. Pulmonary atelectasis.  Mild to moderate cardiomegaly.   Electronically Signed   By: Lars Pinks M.D.   On: 04/21/2014 09:29    Other results: EKG: rate 80, LAD, nl intervals, no LVH, artifact, no acute ST/T changes, pending repeat EKG  Assessment & Plan by Problem: 73 y.o h/o DM, HTN, CAD s/p stent presenting for chest pain  #Chest pain -Trend troponin x 2. With h/o CAD concerned for angina. TIMI score 3-4.  Other ddx include pericarditis as CP is pleuritic with inspiration on exam though does not change when leans forward going against typical features of pericarditis. CT neg for PE or aortic dissection -continue Aspirin 81 mg, NTG prn  -continued Coreg 6.25 mg bid, statin -admit to tele for obs  -will consult cardiology -pending HA1C, lipid, UDS, Trop x 2 -consider echo -will need outpatient f/u with PCP in [place] [employer] (Dr. Daiva Eves) and cardiology in [facility] [place] (Dr. Cheri Fowler)  #HTN (hypertension) -continued Coreg, held Cardura 16 mg qhs and Hyzaar 100-25 mg as BP was soft on admission -can re add home BP meds if needed   #DM type 2 (diabetes mellitus, type 2) -SSI-S. Held home medications (Metformin and Actos) -monitor cbgs   #F/E/N -NSL -BMET in am -H/H diet   #DVT px  -Lov, scds   Dispo: Disposition is deferred at this time, awaiting improvement of current medical problems. Anticipated discharge in approximately 1  day(s).   The patient does have a current  PCP in [place]  and does not need an [facility] hospital follow-up appointment after discharge.  The patient does not have transportation limitations that hinder transportation to clinic appointments.  Signed: Cresenciano Genre, MD [phone] 04/21/2014, 12:55 PM

## 2014-04-21 NOTE — Consult Note (Signed)
CARDIOLOGY CONSULT NOTE   Patient ID: Jamie Howard MRN: [id number] DOB/AGE: 12/21/1940 73 y.o.  Admit date: 04/21/2014  Primary Physician   Leonides Sake, MD Primary Cardiologist  Dr. Bud [place] Reason for Consultation   Chest pain   HPI:  Jamie Howard is a 73 y.o. male with a history of gout, obesity, DM, HTN, HLD, and CAD s/p remote stenting who presented to [facility] today with chest pain.   He is followed by Dr. Atilano Median in [facility] for CAD. He was initially seen about 13 years ago for evaluation of exertional chest pain. He underwent stress testing at that time and was referred for cardiac cath with subsequent stenting. Since that time he has had no issues and has been in his usual state of health. He works out 3-4 times a week with an exercise program and has had no recent exertional chest pain or shortness of breath. This morning he was woken up with acute left-sided shoulder pain that radiated through his left chest. He had no associated shortness of breath, nausea or diaphoresis. It is worse with deep inspiration and feels like a stabbing pain. It is not better or worse with any certain position. At its worst the pain was a 7/10 and is now down to a 4/10 after Aspirin 324 mg, Tylenol 650 mg x 1, Dilaudid 1 mg x 1, Morphine 4 mg x 1, and NTG in the ED.  He was initially giving SL nitroglycerin by EMS with no relief. This chest/back pain is not reminiscent of his prior anginal pain prior to stenting which was described as electric shocks running across his chest with mowing the lawn. He takes all of his medicines as prescribed and follows with his cardiologist yearly. His last stress test was several years ago and reportedly normal. D-dimer was slightly elevated in the ED, however CT chest was negative for PE and aortic dissection.   Past Medical History  Diagnosis Date  . Diabetes mellitus without complication   . Hypertension   . CAD (coronary artery disease)      a. s/p remote stent ~2002  . HLD (hyperlipidemia)      Past Surgical History  Procedure Laterality Date  . Back surgery      ruptured disk 1970s-1980s  . Other surgical history      stent    No Known Allergies  I have reviewed the patient's current medications . [START ON 04/22/2014] aspirin EC  81 mg Oral Daily  . carvedilol  6.25 mg Oral BID WC  . enoxaparin (LOVENOX) injection  40 mg Subcutaneous Q24H  . insulin aspart  0-9 Units Subcutaneous TID WC  . simvastatin  40 mg Oral q1800  . sodium chloride  3 mL Intravenous Q12H     acetaminophen, morphine injection, nitroGLYCERIN  Prior to Admission medications   Medication Sig Start Date End Date Taking? Authorizing Provider  aspirin EC 81 MG tablet Take 81 mg by mouth daily.   Yes Historical Provider, MD  carvedilol (COREG) 25 MG tablet Take 6.25 mg by mouth 2 (two) times daily with a meal.    Yes Historical Provider, MD  colchicine 0.6 MG tablet Take 0.6 mg by mouth daily as needed (gout).   Yes Historical Provider, MD  doxazosin (CARDURA) 8 MG tablet Take 16 mg by mouth at bedtime.   Yes Historical Provider, MD  losartan-hydrochlorothiazide (HYZAAR) 100-25 MG per tablet Take 1 tablet by mouth daily.  Yes Historical Provider, MD  metFORMIN (GLUCOPHAGE-XR) 500 MG 24 hr tablet Take 1,000 mg by mouth 2 (two) times daily.   Yes Historical Provider, MD  pioglitazone (ACTOS) 30 MG tablet Take 30 mg by mouth daily.   Yes Historical Provider, MD  simvastatin (ZOCOR) 40 MG tablet Take 40 mg by mouth daily.   Yes Historical Provider, MD     History   Social History  . Marital Status: Unknown    Spouse Name: N/A    Number of Children: N/A  . Years of Education: N/A   Occupational History  . Not on file.   Social History Main Topics  . Smoking status: Former Research scientist (life sciences)  . Smokeless tobacco: Not on file  . Alcohol Use: Yes     Comment: occassional  . Drug Use: No  . Sexual Activity: Not on file   Other Topics Concern  .  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   Family History  Problem Relation Age of Onset  . Heart attack      dad at 53  . Diabetes      younger brother died complications of DM  . Diabetes      brother 2, DM 2   . Diabetes      aunt type 1  . Leukemia      aunt     ROS:  Full 14 point review of systems complete and found to be negative unless listed above.  Physical Exam: Blood pressure 113/51, pulse 81, temperature 99.4 F (37.4 C), temperature source Oral, resp. rate 19, height 5' 10.5" (1.791 m), weight 220 lb (99.791 kg), SpO2 96 %.  General: resting in bed, NAD HEENT: PERRL b/l, [place]/at, no scleral icterus Cardiac: RRR, no rubs, murmurs or gallops, no chest wall ttp Pulm: clear to auscultation bilaterally, no wheezes, rales, or rhonchi Abd: soft, obese nontender, nondistended, BS present Ext: warm and well perfused, Trace pedal edema Neuro: alert and oriented X3, cranial nerves II-XII grossly intact, moving all 4 extremities  MSK: mild left shoulder blade ttp   Labs:   Lab Results  Component Value Date   WBC 7.8 04/21/2014   HGB 11.7* 04/21/2014   HCT 36.1* 04/21/2014   MCV 88.9 04/21/2014   PLT 164 04/21/2014   No results for input(s): INR in the last 72 hours.   Recent Labs Lab 04/21/14 0635 04/21/14 1310  NA 137  --   K 4.6  --   CL 99  --   CO2 24  --   BUN 26*  --   CREATININE 1.09  --   CALCIUM 9.2  --   PROT  --  6.6  BILITOT  --  1.2  ALKPHOS  --  53  ALT  --  13  AST  --  14  GLUCOSE 214*  --   ALBUMIN  --  3.6   MAGNESIUM  Date Value Ref Range Status  04/21/2014 1.4* 1.5 - 2.5 mg/dL Final    Recent Labs  04/21/14 0635 04/21/14 1310  TROPONINI <0.30 <0.30    Recent Labs  04/21/14 0644  TROPIPOC 0.00   PRO B NATRIURETIC PEPTIDE (BNP)  Date/Time Value Ref Range Status    04/21/2014 06:35 AM 213.2* 0 - 125 pg/mL Final  Lab Results  Component Value Date   CHOL 129 04/21/2014   HDL 65 04/21/2014   LDLCALC 56 04/21/2014   TRIG 41 04/21/2014   Lab Results  Component Value Date   DDIMER 0.74* 04/21/2014    Echo: none  ECG:  Sinus rhythm Incomplete RBBB and LAFB Abnormal R-wave progression, late transition Slight ST elevation inferior leads  Radiology:  Dg Chest 2 View  04/21/2014   CLINICAL DATA:  73 year old male awoke with left upper chest pain. Initial encounter.  EXAM: CHEST  2 VIEW  COMPARISON:  None.  FINDINGS: Mildly low lung volumes. Normal cardiac size and mediastinal contours. Visualized tracheal air column is within normal limits. No pneumothorax, pulmonary edema, pleural effusion or confluent pulmonary opacity. No acute osseous abnormality identified.  IMPRESSION: No acute cardiopulmonary abnormality.   Electronically Signed   By: Lars Pinks M.D.   On: 04/21/2014 07:30   Ct Angio Chest Pe W/cm &/or Wo Cm  04/21/2014   CLINICAL DATA:  73 year old male with acute left chest pain beginning at 0400 hrs. Pain radiating to the left shoulder and back. 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 Initial encounter.  EXAM: CT ANGIOGRAPHY CHEST WITH CONTRAST  TECHNIQUE: Multidetector CT imaging of the chest was performed using the standard protocol during bolus administration of intravenous contrast. Multiplanar CT image reconstructions and MIPs were obtained to evaluate the vascular anatomy.  CONTRAST:  2mL OMNIPAQUE IOHEXOL 350 MG/ML SOLN  COMPARISON:  Chest radiographs 0705 hr today.  FINDINGS: Adequate contrast bolus timing in the pulmonary arterial tree. Respiratory motion, especially at the lung bases. No focal filling defect identified in the pulmonary arterial tree to suggest the presence of acute pulmonary embolism.  Major airways are patent, except for some atelectatic changes. Mild linear in dependent opacity in both lung bases most resembles  atelectasis.  Mediastinal lipomatosis. Mild to moderate cardiomegaly. No pericardial or pleural effusion. Widespread calcified atherosclerosis of the aorta and its branches, including coronary artery calcified plaque. No thoracic aortic dissection or aneurysm. No mediastinal or hilar lymphadenopathy. No axillary lymphadenopathy. Negative thoracic inlet.  Negative visualized liver, gallbladder, spleen, pancreas, adrenal glands, and bowel in the upper abdomen.  Osteopenia. No acute osseous abnormality identified.  Review of the MIP images confirms the above findings.  IMPRESSION: 1. No evidence of acute pulmonary embolus. 2. No thoracic aortic dissection or aneurysm. Calcified plaque, including calcified coronary artery plaque. 3. Pulmonary atelectasis.  Mild to moderate cardiomegaly.   Electronically Signed   By: Lars Pinks M.D.   On: 04/21/2014 09:29    ASSESSMENT AND PLAN:    Principal Problem:   Chest pain Active Problems:   DM type 2 (diabetes mellitus, type 2)   CAD (coronary artery disease)   HTN (hypertension)   HLD (hyperlipidemia)   Hypertension   Diabetes mellitus without complication  Pedro Whiters is a 73 y.o. male with a history of gout, obesity, DM, HTN, HLD, and CAD s/p remote stenting who presented to [facility] today with chest pain.   Chest pain- atypical chest pain, but he does have known CAD and continued RFs.  -- Troponin negative 2. ECG with no ST or TW changes  -- Consider ETT myoview in the AM. No heparin unless troponin returns positive. -- Will give some percocet for back pain.   HLD- LDL 56. Continue statin  HTN- BP well controlled currently on carvedilol -- Home Hyzaar and doxazosin being held  DM- SSI per IM  D-dimer slightly elevated in the ED,  however CT chest was negative for PE and aortic dissection.   SignedCrista Luria 04/21/2014 4:51 PM  Pager [phone]  Co-Sign MD  Patient seen and examined with Nell Range, PA-C. We  discussed all aspects of the encounter. I agree with the assessment and plan as stated above.  CP very atypical and now more in his back. Not reproducible with palpation. ECG and CE are negative. Pain not reminiscent of previous angina.  I have also reviewed CT images. No PE. Aorta well opacified and no dissection.  Will plan ETT/myoview in am.   Benay Spice 5:34 PM

## 2014-04-22 ENCOUNTER — Observation Stay (HOSPITAL_COMMUNITY): Payer: Medicare Other

## 2014-04-22 DIAGNOSIS — E785 Hyperlipidemia, unspecified: Secondary | ICD-10-CM | POA: Diagnosis not present

## 2014-04-22 DIAGNOSIS — R0789 Other chest pain: Secondary | ICD-10-CM | POA: Diagnosis not present

## 2014-04-22 DIAGNOSIS — R079 Chest pain, unspecified: Secondary | ICD-10-CM

## 2014-04-22 DIAGNOSIS — N179 Acute kidney failure, unspecified: Secondary | ICD-10-CM

## 2014-04-22 DIAGNOSIS — E119 Type 2 diabetes mellitus without complications: Secondary | ICD-10-CM | POA: Diagnosis not present

## 2014-04-22 DIAGNOSIS — Z7982 Long term (current) use of aspirin: Secondary | ICD-10-CM | POA: Diagnosis not present

## 2014-04-22 DIAGNOSIS — Z9861 Coronary angioplasty status: Secondary | ICD-10-CM | POA: Diagnosis not present

## 2014-04-22 DIAGNOSIS — Z87891 Personal history of nicotine dependence: Secondary | ICD-10-CM | POA: Diagnosis not present

## 2014-04-22 DIAGNOSIS — I4510 Unspecified right bundle-branch block: Secondary | ICD-10-CM | POA: Diagnosis present

## 2014-04-22 DIAGNOSIS — J9811 Atelectasis: Secondary | ICD-10-CM | POA: Diagnosis present

## 2014-04-22 DIAGNOSIS — I517 Cardiomegaly: Secondary | ICD-10-CM | POA: Diagnosis present

## 2014-04-22 DIAGNOSIS — I10 Essential (primary) hypertension: Secondary | ICD-10-CM | POA: Diagnosis not present

## 2014-04-22 DIAGNOSIS — I319 Disease of pericardium, unspecified: Secondary | ICD-10-CM | POA: Diagnosis not present

## 2014-04-22 DIAGNOSIS — R52 Pain, unspecified: Secondary | ICD-10-CM | POA: Insufficient documentation

## 2014-04-22 DIAGNOSIS — R072 Precordial pain: Secondary | ICD-10-CM | POA: Diagnosis not present

## 2014-04-22 DIAGNOSIS — I484 Atypical atrial flutter: Secondary | ICD-10-CM | POA: Diagnosis not present

## 2014-04-22 DIAGNOSIS — I2510 Atherosclerotic heart disease of native coronary artery without angina pectoris: Secondary | ICD-10-CM | POA: Diagnosis not present

## 2014-04-22 DIAGNOSIS — I483 Typical atrial flutter: Secondary | ICD-10-CM | POA: Diagnosis not present

## 2014-04-22 DIAGNOSIS — I4892 Unspecified atrial flutter: Secondary | ICD-10-CM | POA: Diagnosis not present

## 2014-04-22 LAB — TROPONIN I

## 2014-04-22 LAB — BASIC METABOLIC PANEL
Anion gap: 20 — ABNORMAL HIGH (ref 5–15)
BUN: 36 mg/dL — AB (ref 6–23)
CALCIUM: 9.2 mg/dL (ref 8.4–10.5)
CO2: 18 meq/L — AB (ref 19–32)
CREATININE: 2.43 mg/dL — AB (ref 0.50–1.35)
Chloride: 98 mEq/L (ref 96–112)
GFR calc Af Amer: 29 mL/min — ABNORMAL LOW (ref >90)
GFR, EST NON AFRICAN AMERICAN: 25 mL/min — AB (ref >90)
Glucose, Bld: 219 mg/dL — ABNORMAL HIGH (ref 70–99)
Potassium: 5 mEq/L (ref 3.7–5.3)
SODIUM: 136 meq/L — AB (ref 137–147)

## 2014-04-22 LAB — GLUCOSE, CAPILLARY
Glucose-Capillary: 185 mg/dL — ABNORMAL HIGH (ref 70–99)
Glucose-Capillary: 276 mg/dL — ABNORMAL HIGH (ref 70–99)
Glucose-Capillary: 206 mg/dL — ABNORMAL HIGH (ref 70–99)

## 2014-04-22 MED ORDER — REGADENOSON 0.4 MG/5ML IV SOLN
INTRAVENOUS | Status: AC
  Administered 2014-04-22: 0.4 mg via INTRAVENOUS
  Filled 2014-04-22: qty 5

## 2014-04-22 MED ORDER — FLUOROURACIL 5 % EX CREA
TOPICAL_CREAM | Freq: Two times a day (BID) | CUTANEOUS | Status: DC
  Administered 2014-04-22 – 2014-04-23 (×3): via TOPICAL

## 2014-04-22 MED ORDER — TECHNETIUM TC 99M SESTAMIBI GENERIC - CARDIOLITE
10.0000 | Freq: Once | INTRAVENOUS | Status: AC | PRN
  Administered 2014-04-22: 10 via INTRAVENOUS

## 2014-04-22 MED ORDER — SODIUM CHLORIDE 0.9 % IV SOLN
INTRAVENOUS | Status: DC
  Administered 2014-04-22 – 2014-04-23 (×2): via INTRAVENOUS

## 2014-04-22 MED ORDER — REGADENOSON 0.4 MG/5ML IV SOLN
0.4000 mg | Freq: Once | INTRAVENOUS | Status: AC
  Administered 2014-04-22: 0.4 mg via INTRAVENOUS
  Filled 2014-04-22: qty 5

## 2014-04-22 MED ORDER — TECHNETIUM TC 99M SESTAMIBI - CARDIOLITE
30.0000 | Freq: Once | INTRAVENOUS | Status: AC | PRN
  Administered 2014-04-22: 30 via INTRAVENOUS

## 2014-04-22 MED ORDER — MUPIROCIN CALCIUM 2 % EX CREA
TOPICAL_CREAM | Freq: Every day | CUTANEOUS | Status: DC
  Administered 2014-04-22: 12:00:00 via TOPICAL
  Filled 2014-04-22: qty 15

## 2014-04-22 NOTE — Progress Notes (Signed)
Reviewed recent EKGs from today 1345 and 1448 which show ST elevation in inferior leads and flutter rhythm. Discussed with IM team. Discussed the EKGs with Cards PA on call. Residents examined the patient and report unchanged exam from before. The intensity of CP has not increased. Agree with resident plan to keep the patient on observation here another 24 hours, trend troponins and EKGs and get ESR and CRP levels.

## 2014-04-22 NOTE — Progress Notes (Signed)
Subjective: With intermittent positional chest pain. Pain also worsen with deep breathing and cough.   Objective: Vital signs in last 24 hours: Temp:  [98.6 F (37 C)-99.4 F (37.4 C)] 98.6 F (37 C) (12/12 0555) Pulse Rate:  [65-91] 86 (12/12 0918) Resp:  [19-23] 19 (12/11 1305) BP: (94-130)/(39-53) 118/45 mmHg (12/12 0918) SpO2:  [90 %-97 %] 95 % (12/12 0555) Weight:  [219 lb (99.338 kg)] 219 lb (99.338 kg) (12/12 0555) Last BM Date: 04/20/14  Intake/Output from previous day: 12/11 0701 - 12/12 0700 In: 240 [P.O.:240] Out: -  Intake/Output this shift:    Medications Current Facility-Administered Medications  Medication Dose Route Frequency Provider Last Rate Last Dose  . 0.9 %  sodium chloride infusion   Intravenous Continuous Francesca Oman, DO      . acetaminophen (TYLENOL) tablet 650 mg  650 mg Oral Q6H PRN Fransico Meadow, PA-C      . aspirin EC tablet 81 mg  81 mg Oral Daily Cresenciano Genre, MD      . carvedilol (COREG) tablet 6.25 mg  6.25 mg Oral BID WC Cresenciano Genre, MD   6.25 mg at 04/21/14 1728  . enoxaparin (LOVENOX) injection 40 mg  40 mg Subcutaneous Q24H Cresenciano Genre, MD   40 mg at 04/21/14 1400  . insulin aspart (novoLOG) injection 0-9 Units  0-9 Units Subcutaneous TID WC Cresenciano Genre, MD   5 Units at 04/21/14 1742  . morphine 2 MG/ML injection 1 mg  1 mg Intravenous Q4H PRN Cresenciano Genre, MD   1 mg at 04/21/14 1227  . nitroGLYCERIN (NITROSTAT) SL tablet 0.4 mg  0.4 mg Sublingual Q5 min PRN Fransico Meadow, PA-C      . oxyCODONE-acetaminophen (PERCOCET/ROXICET) 5-325 MG per tablet 1 tablet  1 tablet Oral Q3H PRN Eileen Stanford, PA-C   1 tablet at 04/21/14 2046   And  . oxyCODONE (Oxy IR/ROXICODONE) immediate release tablet 5 mg  5 mg Oral Q3H PRN Eileen Stanford, PA-C   5 mg at 04/21/14 2046  . simvastatin (ZOCOR) tablet 40 mg  40 mg Oral q1800 Cresenciano Genre, MD   40 mg at 04/21/14 1728  . sodium chloride 0.9 % injection 3 mL  3 mL Intravenous  Q12H Cresenciano Genre, MD      . technetium sestamibi (CARDIOLITE) injection [place] Intravenous Once PRN Medication Radiologist, MD        PE: General appearance: alert, cooperative and no distress Lungs: clear to auscultation bilaterally Heart: regular rate and rhythm, S1, S2 normal, no murmur, click, rub or gallop Extremities: no LEE Pulses: 2+ and symmetric Skin: warm and dry Neurologic: Grossly normal  Lab Results:   Recent Labs  04/21/14 0635  WBC 7.8  HGB 11.7*  HCT 36.1*  PLT 164   BMET  Recent Labs  04/21/14 0635 04/22/14 0347  NA 137 136*  K 4.6 5.0  CL 99 98  CO2 24 18*  GLUCOSE 214* 219*  BUN 26* 36*  CREATININE 1.09 2.43*  CALCIUM 9.2 9.2   PT/INR No results for input(s): LABPROT, INR in the last 72 hours. Cholesterol  Recent Labs  04/21/14 1310  CHOL 129   Cardiac Panel (last 3 results)  Recent Labs  04/21/14 0635 04/21/14 1310 04/21/14 1840  TROPONINI <0.30 <0.30 <0.30     Assessment/Plan    Principal Problem:   Chest pain Active Problems:  DM type 2 (diabetes mellitus, type 2)   CAD (coronary artery disease)   HTN (hypertension)   HLD (hyperlipidemia)   Hypertension   Diabetes mellitus without complication  1. Atypical CP: cardiac enzymes negative x 3. Lexiscan NST completed today. No EKG changes. Radiologist interpretation to follow. Can d/c home today if normal study. If abnormal, will plan for [facility] on Monday.     LOS: 1 day    Brittainy M. Rosita Fire, PA-C 04/22/2014 9:54 AM  I have seen, examined the patient, and reviewed the above assessment and plan.  Changes to above are made where necessary.  If myoview is low risk, would anticipate discharge later today.  IF high risk, further CV tesing may be required.  Cardiology to see as needed Please call with questions.  Co Sign: Thompson Grayer, MD 04/22/2014 10:03 AM

## 2014-04-22 NOTE — Progress Notes (Signed)
Subjective: Jamie Howard. Doing well this morning. Reports having the chest soreness still. Denies shortness of breath, N/V, abdominal pain.  Objective: Vital signs in last 24 hours: Filed Vitals:   04/22/14 0912 04/22/14 0914 04/22/14 0916 04/22/14 0918  BP: 126/48 108/44 125/48 118/45  Pulse: 91 85 87 86  Temp:      TempSrc:      Resp:      Height:      Weight:      SpO2:       Weight change: -1 lb (-0.454 kg)  Intake/Output Summary (Last 24 hours) at 04/22/14 1305 Last data filed at 04/22/14 0900  Gross per 24 hour  Intake    240 ml  Output      0 ml  Net    240 ml   General: NAD HEENT: PERRL b/l, [place]/at, no scleral icterus Cardiac: RRR, no rubs, murmurs or gallops, no chest wall ttp Pulm: clear to auscultation bilaterally, no wheezes, rales, or rhonchi Abd: soft, nontender, nondistended, BS present Ext: warm and well perfused, 1+ pedal edema Neuro: alert and oriented X3, cranial nerves II-XII grossly intact, moving all 4 extremities  MSK: mild left shoulder blade ttp, R shoulder incision c/d/i, no erythema or drainage, appropriately tender to palpation  Lab Results: Basic Metabolic Panel:  Recent Labs Lab 04/21/14 0635 04/21/14 1310 04/22/14 0347  NA 137  --  136*  K 4.6  --  5.0  CL 99  --  98  CO2 24  --  18*  GLUCOSE 214*  --  219*  BUN 26*  --  36*  CREATININE 1.09  --  2.43*  CALCIUM 9.2  --  9.2  MG  --  1.4*  --    Liver Function Tests:  Recent Labs Lab 04/21/14 1310  AST 14  ALT 13  ALKPHOS 53  BILITOT 1.2  PROT 6.6  ALBUMIN 3.6   CBC:  Recent Labs Lab 04/21/14 0635  WBC 7.8  HGB 11.7*  HCT 36.1*  MCV 88.9  PLT 164   Cardiac Enzymes:  Recent Labs Lab 04/21/14 0635 04/21/14 1310 04/21/14 1840  TROPONINI <0.30 <0.30 <0.30   BNP:  Recent Labs Lab 04/21/14 0635  PROBNP 213.2*   D-Dimer:  Recent Labs Lab 04/21/14 0635  DDIMER 0.74*   CBG:  Recent Labs Lab 04/21/14 1621 04/21/14 2133 04/22/14 1039  GLUCAP 298*  238* 185*   Hemoglobin A1C:  Recent Labs Lab 04/21/14 1310  HGBA1C 6.9*   Fasting Lipid Panel:  Recent Labs Lab 04/21/14 1310  CHOL 129  HDL 65  LDLCALC 56  TRIG 41  CHOLHDL 2.0   Urine Drug Screen: Drugs of Abuse     Component Value Date/Time   LABOPIA POSITIVE* 04/21/2014 1226   COCAINSCRNUR NONE DETECTED 04/21/2014 1226   LABBENZ NONE DETECTED 04/21/2014 1226   AMPHETMU NONE DETECTED 04/21/2014 1226   THCU NONE DETECTED 04/21/2014 1226   LABBARB NONE DETECTED 04/21/2014 1226    Urinalysis:  Recent Labs Lab 04/21/14 1226  COLORURINE YELLOW  LABSPEC 1.044*  PHURINE 5.0  GLUCOSEU NEGATIVE  HGBUR NEGATIVE  BILIRUBINUR NEGATIVE  KETONESUR NEGATIVE  PROTEINUR NEGATIVE  UROBILINOGEN 1.0  NITRITE NEGATIVE  LEUKOCYTESUR NEGATIVE    Micro Results: Recent Results (from the past 240 hour(s))  MRSA PCR Screening     Status: None   Collection Time: 04/21/14  1:23 PM  Result Value Ref Range Status   MRSA by PCR NEGATIVE NEGATIVE Final    Comment:  The GeneXpert MRSA Assay (FDA approved for NASAL specimens only), is one component of a comprehensive MRSA colonization surveillance program. It is not intended to diagnose MRSA infection nor to guide or monitor treatment for MRSA infections.    Studies/Results: Dg Chest 2 View  04/21/2014   CLINICAL DATA:  73 year old male awoke with left upper chest pain. Initial encounter.  EXAM: CHEST  2 VIEW  COMPARISON:  None.  FINDINGS: Mildly low lung volumes. Normal cardiac size and mediastinal contours. Visualized tracheal air column is within normal limits. No pneumothorax, pulmonary edema, pleural effusion or confluent pulmonary opacity. No acute osseous abnormality identified.  IMPRESSION: No acute cardiopulmonary abnormality.   Electronically Signed   By: Lars Pinks M.D.   On: 04/21/2014 07:30   Ct Angio Chest Pe W/cm &/or Wo Cm  04/21/2014   CLINICAL DATA:  73 year old male with acute left chest pain  beginning at 0400 hrs. Pain radiating to the left shoulder and back. 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 Initial encounter.  EXAM: CT ANGIOGRAPHY CHEST WITH CONTRAST  TECHNIQUE: Multidetector CT imaging of the chest was performed using the standard protocol during bolus administration of intravenous contrast. Multiplanar CT image reconstructions and MIPs were obtained to evaluate the vascular anatomy.  CONTRAST:  39mL OMNIPAQUE IOHEXOL 350 MG/ML SOLN  COMPARISON:  Chest radiographs 0705 hr today.  FINDINGS: Adequate contrast bolus timing in the pulmonary arterial tree. Respiratory motion, especially at the lung bases. No focal filling defect identified in the pulmonary arterial tree to suggest the presence of acute pulmonary embolism.  Major airways are patent, except for some atelectatic changes. Mild linear in dependent opacity in both lung bases most resembles atelectasis.  Mediastinal lipomatosis. Mild to moderate cardiomegaly. No pericardial or pleural effusion. Widespread calcified atherosclerosis of the aorta and its branches, including coronary artery calcified plaque. No thoracic aortic dissection or aneurysm. No mediastinal or hilar lymphadenopathy. No axillary lymphadenopathy. Negative thoracic inlet.  Negative visualized liver, gallbladder, spleen, pancreas, adrenal glands, and bowel in the upper abdomen.  Osteopenia. No acute osseous abnormality identified.  Review of the MIP images confirms the above findings.  IMPRESSION: 1. No evidence of acute pulmonary embolus. 2. No thoracic aortic dissection or aneurysm. Calcified plaque, including calcified coronary artery plaque. 3. Pulmonary atelectasis.  Mild to moderate cardiomegaly.   Electronically Signed   By: Lars Pinks M.D.   On: 04/21/2014 09:29   Nm Myocar Multi W/spect W/wall Motion / Ef  04/22/2014   CLINICAL DATA:  73 year old male presenting yesterday with acute onset left-sided chest pain radiating into the left shoulder and back.   EXAM: MYOCARDIAL IMAGING WITH SPECT (REST AND PHARMACOLOGIC-STRESS)  GATED LEFT VENTRICULAR WALL MOTION STUDY  LEFT VENTRICULAR EJECTION FRACTION  TECHNIQUE: Standard myocardial SPECT imaging was performed after resting intravenous injection of 10 mCi Tc-67m sestamibi. Subsequently, intravenous infusion of Lexiscan was performed under the supervision of the Cardiology staff. At peak effect of the drug, 30 mCi Tc-92m sestamibi was injected intravenously and standard myocardial SPECT imaging was performed. Quantitative gated imaging was also performed to evaluate left ventricular wall motion, and estimate left ventricular ejection fraction.  COMPARISON:  None.  FINDINGS: Perfusion: Slight diminished uptake in the inferior wall and inferolateral wall which can be explained on the basis of diaphragmatic attenuation. No evidence of reversibility.  Wall Motion: Normal left ventricular wall motion. No left ventricular dilation.  Left Ventricular Ejection Fraction: 06%  End diastolic volume 96 ml  End systolic volume 29 ml  IMPRESSION: 1.  No reversible ischemia or infarction.  2. Normal left ventricular wall motion.  3. Left ventricular ejection fraction 69%.  4. Low-risk stress test findings*.  *2012 Appropriate Use Criteria for Coronary Revascularization Focused Update: J Am Coll Cardiol. 9379;02(4):097-353. http://content.airportbarriers.com.aspx?articleid=1201161   Electronically Signed   By: Evangeline Dakin M.D.   On: 04/22/2014 12:03   Medications: I have reviewed the patient's current medications. Scheduled Meds: . aspirin EC  81 mg Oral Daily  . carvedilol  6.25 mg Oral BID WC  . enoxaparin (LOVENOX) injection  40 mg Subcutaneous Q24H  . fluorouracil   Topical BID  . insulin aspart  0-9 Units Subcutaneous TID WC  . mupirocin cream   Topical Daily  . simvastatin  40 mg Oral q1800  . sodium chloride  3 mL Intravenous Q12H   Continuous Infusions: . sodium chloride 75 mL/hr at 04/22/14 0745   PRN  Meds:acetaminophen, morphine injection, nitroGLYCERIN, oxyCODONE-acetaminophen **AND** oxyCODONE   Assessment/Plan: Principal Problem:   Chest pain Active Problems:   DM type 2 (diabetes mellitus, type 2)   CAD (coronary artery disease)   HTN (hypertension)   HLD (hyperlipidemia)   Hypertension   Diabetes mellitus without complication  Chest pain: Troponins negative x 3. Seen by cardiology. Lexican NST with no reversible ischemia or infarction, normal left ventricular wall motion, LV EF 69%, low risk stress test findings. Hgb A1c 6.9%, lipid panel wnl. -follow up repeat EKG -continue Aspirin 81 mg, NTG prn  -continued Coreg 6.25 mg bid, statin -will need outpatient f/u with PCP in [place] (Dr. Daiva Eves) and cardiology in [facility] (Dr. Cheri Fowler)  AKI: Cr up to 2.43 from 1.09. Likely 2/2 IV contrast. -NS@125cc /hr -Repeat BMP in AM  HTN (hypertension) -continued Coreg, held Cardura 16 mg qhs and Hyzaar 100-25 mg as BP was soft on admission -can re add home BP meds if needed   DM type 2 (diabetes mellitus, type 2) -SSI-S. Held home medications (Metformin and Actos) -monitor cbgs   FEN -H/H diet   DVT ppx  -Lov, scds  Dispo: Disposition is deferred at this time, awaiting improvement of current medical problems.  Anticipated discharge in approximately 1 day(s).   The patient does have a current PCP (Leonides Sake, MD) and does not need an [facility] hospital follow-up appointment after discharge.  The patient does not have transportation limitations that hinder transportation to clinic appointments.  .Services Needed at time of discharge: Y = Yes, Blank = No PT:   OT:   RN:   Equipment:   Other:     LOS: 1 day   Jacques Earthly, MD 04/22/2014, 1:05 PM

## 2014-04-22 NOTE — Progress Notes (Signed)
Interim Progress Note  During chart review, EKG ordered this AM was done this PM and concerning for acute MI.  I called the RN to find out if the patient was having chest pain.  The RN said the patient had not complained of new or worsening CP.  EKG had been ordered this AM but not done until PM.  When I went to see the patient he was with family, sitting up in chair, no acute distress.  He says chest pain is still there but is improving.  He has not had worsening of symptoms.  He denies dyspnea.   Telemetry - no red alarms, yellow alarms with PACs, PVCs and RBBB T 98.75F, RR 20, HR 85, BP 142/56, SpO2 100% on RA, tele shows NSR General:  Sitting up in chair at bedside in NAD; family present Cardio:  RRR, +S1, +S2, no murmurs/rubs/gallops, distal pulses intact B/L Lung:  CTA B/L Abdomen:  + BS, soft, NT LE: warm and well perfused, no edema or pain Neuro:  AAO x 3, 5/5 muscle strength upper and lower extremities EKG: ST elevations in III similar to prior, new TWI in I, II, III, aVL, new aflutter (however there a normal p waves in several leads)  73 year old male with PMH of CAD w/ stent in 2002 here with left shoulder pain and pleuritic chest pain x 2 days.  Initial troponins and EKG negative.  CTA negative for acute dissection or PE.  He had a low risk Lexiscan stress test today.  Repeat EKG with new findings of TWIs in several leads, ?aflutter, ST elevation in lead III.  Spoke with cardiology regarding results.  Less likely ACS given atypical pain, negative troponin and reassuring Lexiscan, however will re-trend troponins, continue telemetry and repeat EKG tonight to r/o ACS.  Pericarditis is a possibility given pleuritic chest pain, however this usually produces diffuse ST elevations.   - troponin x 3, ESR and CRP - repeat EKG this PM and tomorrow AM - continue telemetry monitoring - cardiology to re-evaluate in the AM  Duwaine Maxin, DO [place], PGY2  [phone]

## 2014-04-23 DIAGNOSIS — I483 Typical atrial flutter: Secondary | ICD-10-CM

## 2014-04-23 DIAGNOSIS — I319 Disease of pericardium, unspecified: Secondary | ICD-10-CM

## 2014-04-23 LAB — GLUCOSE, CAPILLARY
Glucose-Capillary: 175 mg/dL — ABNORMAL HIGH (ref 70–99)
Glucose-Capillary: 275 mg/dL — ABNORMAL HIGH (ref 70–99)
Glucose-Capillary: 162 mg/dL — ABNORMAL HIGH (ref 70–99)
Glucose-Capillary: 233 mg/dL — ABNORMAL HIGH (ref 70–99)

## 2014-04-23 LAB — BASIC METABOLIC PANEL
ANION GAP: 13 (ref 5–15)
BUN: 52 mg/dL — ABNORMAL HIGH (ref 6–23)
CALCIUM: 8.2 mg/dL — AB (ref 8.4–10.5)
CO2: 23 mEq/L (ref 19–32)
Chloride: 98 mEq/L (ref 96–112)
Creatinine, Ser: 1.92 mg/dL — ABNORMAL HIGH (ref 0.50–1.35)
GFR calc Af Amer: 38 mL/min — ABNORMAL LOW (ref >90)
GFR, EST NON AFRICAN AMERICAN: 33 mL/min — AB (ref >90)
Glucose, Bld: 190 mg/dL — ABNORMAL HIGH (ref 70–99)
Potassium: 4 mEq/L (ref 3.7–5.3)
SODIUM: 134 meq/L — AB (ref 137–147)

## 2014-04-23 LAB — TROPONIN I

## 2014-04-23 LAB — PROTIME-INR
INR: 1.06 (ref 0.00–1.49)
PROTHROMBIN TIME: 14 s (ref 11.6–15.2)

## 2014-04-23 LAB — C-REACTIVE PROTEIN: CRP: 19.8 mg/dL — AB (ref <0.60)

## 2014-04-23 LAB — SEDIMENTATION RATE: Sed Rate: 57 mm/hr — ABNORMAL HIGH (ref 0–16)

## 2014-04-23 MED ORDER — WARFARIN SODIUM 7.5 MG PO TABS
7.5000 mg | ORAL_TABLET | Freq: Once | ORAL | Status: AC
  Administered 2014-04-23: 7.5 mg via ORAL
  Filled 2014-04-23: qty 1

## 2014-04-23 MED ORDER — WARFARIN VIDEO
Freq: Once | Status: AC
  Administered 2014-04-23: 17:00:00

## 2014-04-23 MED ORDER — CARVEDILOL 12.5 MG PO TABS
12.5000 mg | ORAL_TABLET | Freq: Two times a day (BID) | ORAL | Status: DC
  Administered 2014-04-23 – 2014-04-24 (×2): 12.5 mg via ORAL
  Filled 2014-04-23 (×2): qty 1

## 2014-04-23 MED ORDER — COUMADIN BOOK
Freq: Once | Status: AC
  Administered 2014-04-23: 14:00:00
  Filled 2014-04-23: qty 1

## 2014-04-23 MED ORDER — WARFARIN - PHARMACIST DOSING INPATIENT
Freq: Every day | Status: DC
  Administered 2014-04-23: 18:00:00

## 2014-04-23 MED ORDER — PREDNISONE 5 MG PO TABS: 25.0000 mg | ORAL_TABLET | Freq: Every day | ORAL | Status: DC

## 2014-04-23 MED ORDER — COLCHICINE 0.6 MG PO TABS
0.6000 mg | ORAL_TABLET | Freq: Two times a day (BID) | ORAL | Status: DC
  Administered 2014-04-23 – 2014-04-24 (×2): 0.6 mg via ORAL
  Filled 2014-04-23 (×2): qty 1

## 2014-04-23 NOTE — Progress Notes (Addendum)
ANTICOAGULATION CONSULT NOTE - Initial Consult  Pharmacy Consult for warfarin Indication: atrial flutter  No Known Allergies  Patient Measurements: Height: 5' 10.5" (179.1 cm) Weight: 224 lb 11.2 oz (101.923 kg) IBW/kg (Calculated) : 74.15  Vital Signs: Temp: 98.2 F (36.8 C) (12/13 0500) Temp Source: Oral (12/13 0500) BP: 153/55 mmHg (12/13 0810) Pulse Rate: 90 (12/13 0500)  Labs:  Recent Labs  04/21/14 0635  04/22/14 0347 04/22/14 1500 04/22/14 2017 04/23/14 0236 04/23/14 1053  HGB 11.7*  --   --   --   --   --   --   HCT 36.1*  --   --   --   --   --   --   PLT 164  --   --   --   --   --   --   LABPROT  --   --   --   --   --   --  14.0  INR  --   --   --   --   --   --  1.06  CREATININE 1.09  --  2.43*  --   --  1.92*  --   TROPONINI <0.30  < >  --  <0.30 <0.30 <0.30  --   < > = values in this interval not displayed.  Estimated Creatinine Clearance: 41.3 mL/min (by C-G formula based on Cr of 1.92).   Medical History: Past Medical History  Diagnosis Date  . Diabetes mellitus without complication   . Hypertension   . CAD (coronary artery disease)     a. s/p remote stent ~2002  . HLD (hyperlipidemia)     Medications:  Prescriptions prior to admission  Medication Sig Dispense Refill Last Dose  . aspirin EC 81 MG tablet Take 81 mg by mouth daily.   04/21/2014 at Unknown time  . carvedilol (COREG) 25 MG tablet Take 6.25 mg by mouth 2 (two) times daily with a meal.    04/20/2014 at 2400  . colchicine 0.6 MG tablet Take 0.6 mg by mouth daily as needed (gout).   unknown  . doxazosin (CARDURA) 8 MG tablet Take 16 mg by mouth at bedtime.   04/20/2014 at Unknown time  . fluorouracil (EFUDEX) 5 % cream Apply topically 2 (two) times daily.     Marland Kitchen losartan-hydrochlorothiazide (HYZAAR) 100-25 MG per tablet Take 1 tablet by mouth daily.   04/20/2014 at Unknown time  . metFORMIN (GLUCOPHAGE-XR) 500 MG 24 hr tablet Take 1,000 mg by mouth 2 (two) times daily.   04/20/2014  at Unknown time  . mupirocin ointment (BACTROBAN) 2 % Apply 1 application topically daily. Apply to wound on back     . pioglitazone (ACTOS) 30 MG tablet Take 30 mg by mouth daily.   04/20/2014 at Unknown time  . simvastatin (ZOCOR) 40 MG tablet Take 40 mg by mouth daily.   04/20/2014 at Unknown time    Assessment: 73 y/o male who presented to the ED with L sided chest pressure found to have atrial flutter. Pharmacy consulted to begin warfarin over NOAC as patient has AKI which is improving. Baseline INR is normal at 1.06. CHADS2VASC is 4. No bleeding noted, Hb is 11.7 and platelets are normal.  Goal of Therapy:  INR 2-3 Monitor platelets by anticoagulation protocol: Yes   Plan:  - Warfarin 7.5 mg PO tonight - INR daily - Warfarin book and video ordered - Enoxaparin until INR >2 - If renal function improves prior to discharge,  consider changing to NOAC - Monitor for s/sx of bleeding  [facility] [place] Hights, Pharm.D., BCPS Clinical Pharmacist Pager: [phone] 04/23/2014 12:38 PM

## 2014-04-23 NOTE — Progress Notes (Addendum)
Subjective: Pt seen and examined this AM.  No CP at rest, but still present with breathing.  No dyspnea or palpitations.  He is comfortable, sitting up in chair at bedside watching tv with his wife.  Appetite is normal.   The cardiologist spoke with him about starting coumadin for aflutter and he is agreeable.  He would like to have follow-up with his own cardiologist in [facility] after d/c.  Objective: Vital signs in last 24 hours: Filed Vitals:   04/22/14 1336 04/22/14 1506 04/22/14 2100 04/23/14 0500  BP: 81/47 142/56 104/52 133/58  Pulse: 89 85 93 90  Temp: 98.6 F (37 C) 98.3 F (36.8 C) 99.5 F (37.5 C) 98.2 F (36.8 C)  TempSrc: Oral Oral Oral Oral  Resp: 18 20 18 18   Height:      Weight:    101.923 kg (224 lb 11.2 oz)  SpO2: 95% 100% 99% 97%   Weight change: 2.586 kg (5 lb 11.2 oz)  Intake/Output Summary (Last 24 hours) at 04/23/14 0713 Last data filed at 04/23/14 0456  Gross per 24 hour  Intake      0 ml  Output    700 ml  Net   -700 ml   General: NAD HEENT: [place]/AT Cardiac: RRR, no rubs, murmurs or gallops, no chest wall TTP, distal pulses intact Pulm: clear to auscultation bilaterally, no wheezes, rales, or rhonchi Abd: soft, nontender, nondistended, BS present Ext: warm and well perfused, 1+ pedal edema B/L Neuro: alert and oriented X3, responding appropriately, moving all 4 extremities, 5/5 MMS upper and lower extremities  Lab Results: Basic Metabolic Panel:  Recent Labs Lab 04/21/14 1310 04/22/14 0347 04/23/14 0236  NA  --  136* 134*  K  --  5.0 4.0  CL  --  98 98  CO2  --  18* 23  GLUCOSE  --  219* 190*  BUN  --  36* 52*  CREATININE  --  2.43* 1.92*  CALCIUM  --  9.2 8.2*  MG 1.4*  --   --    Liver Function Tests:  Recent Labs Lab 04/21/14 1310  AST 14  ALT 13  ALKPHOS 53  BILITOT 1.2  PROT 6.6  ALBUMIN 3.6   CBC:  Recent Labs Lab 04/21/14 0635  WBC 7.8  HGB 11.7*  HCT 36.1*  MCV 88.9  PLT 164   Cardiac Enzymes:  Recent  Labs Lab 04/22/14 1500 04/22/14 2017 04/23/14 0236  TROPONINI <0.30 <0.30 <0.30   BNP:  Recent Labs Lab 04/21/14 0635  PROBNP 213.2*   D-Dimer:  Recent Labs Lab 04/21/14 0635  DDIMER 0.74*   CBG:  Recent Labs Lab 04/21/14 1621 04/21/14 2133 04/22/14 1039 04/22/14 1613 04/22/14 2039  GLUCAP 298* 238* 185* 276* 206*   Hemoglobin A1C:  Recent Labs Lab 04/21/14 1310  HGBA1C 6.9*   Fasting Lipid Panel:  Recent Labs Lab 04/21/14 1310  CHOL 129  HDL 65  LDLCALC 56  TRIG 41  CHOLHDL 2.0   Urine Drug Screen: Drugs of Abuse     Component Value Date/Time   LABOPIA POSITIVE* 04/21/2014 1226   COCAINSCRNUR NONE DETECTED 04/21/2014 1226   LABBENZ NONE DETECTED 04/21/2014 1226   AMPHETMU NONE DETECTED 04/21/2014 1226   THCU NONE DETECTED 04/21/2014 1226   LABBARB NONE DETECTED 04/21/2014 1226    Urinalysis:  Recent Labs Lab 04/21/14 1226  COLORURINE YELLOW  LABSPEC 1.044*  PHURINE 5.0  GLUCOSEU NEGATIVE  HGBUR NEGATIVE  BILIRUBINUR NEGATIVE  KETONESUR  NEGATIVE  PROTEINUR NEGATIVE  UROBILINOGEN 1.0  NITRITE NEGATIVE  LEUKOCYTESUR NEGATIVE    Micro Results: Recent Results (from the past 240 hour(s))  MRSA PCR Screening     Status: None   Collection Time: 04/21/14  1:23 PM  Result Value Ref Range Status   MRSA by PCR NEGATIVE NEGATIVE Final    Comment:        The GeneXpert MRSA Assay (FDA approved for NASAL specimens only), is one component of a comprehensive MRSA colonization surveillance program. It is not intended to diagnose MRSA infection nor to guide or monitor treatment for MRSA infections.    Studies/Results: Ct Angio Chest Pe W/cm &/or Wo Cm  04/21/2014   CLINICAL DATA:  73 year old male with acute left chest pain beginning at 0400 hrs. Pain radiating to the left shoulder and back. 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 Initial encounter.  EXAM: CT ANGIOGRAPHY CHEST WITH CONTRAST  TECHNIQUE: Multidetector CT imaging of  the chest was performed using the standard protocol during bolus administration of intravenous contrast. Multiplanar CT image reconstructions and MIPs were obtained to evaluate the vascular anatomy.  CONTRAST:  68mL OMNIPAQUE IOHEXOL 350 MG/ML SOLN  COMPARISON:  Chest radiographs 0705 hr today.  FINDINGS: Adequate contrast bolus timing in the pulmonary arterial tree. Respiratory motion, especially at the lung bases. No focal filling defect identified in the pulmonary arterial tree to suggest the presence of acute pulmonary embolism.  Major airways are patent, except for some atelectatic changes. Mild linear in dependent opacity in both lung bases most resembles atelectasis.  Mediastinal lipomatosis. Mild to moderate cardiomegaly. No pericardial or pleural effusion. Widespread calcified atherosclerosis of the aorta and its branches, including coronary artery calcified plaque. No thoracic aortic dissection or aneurysm. No mediastinal or hilar lymphadenopathy. No axillary lymphadenopathy. Negative thoracic inlet.  Negative visualized liver, gallbladder, spleen, pancreas, adrenal glands, and bowel in the upper abdomen.  Osteopenia. No acute osseous abnormality identified.  Review of the MIP images confirms the above findings.  IMPRESSION: 1. No evidence of acute pulmonary embolus. 2. No thoracic aortic dissection or aneurysm. Calcified plaque, including calcified coronary artery plaque. 3. Pulmonary atelectasis.  Mild to moderate cardiomegaly.   Electronically Signed   By: Lars Pinks M.D.   On: 04/21/2014 09:29   Nm Myocar Multi W/spect W/wall Motion / Ef  04/22/2014   CLINICAL DATA:  73 year old male presenting yesterday with acute onset left-sided chest pain radiating into the left shoulder and back.  EXAM: MYOCARDIAL IMAGING WITH SPECT (REST AND PHARMACOLOGIC-STRESS)  GATED LEFT VENTRICULAR WALL MOTION STUDY  LEFT VENTRICULAR EJECTION FRACTION  TECHNIQUE: Standard myocardial SPECT imaging was performed after  resting intravenous injection of 10 mCi Tc-84m sestamibi. Subsequently, intravenous infusion of Lexiscan was performed under the supervision of the Cardiology staff. At peak effect of the drug, 30 mCi Tc-83m sestamibi was injected intravenously and standard myocardial SPECT imaging was performed. Quantitative gated imaging was also performed to evaluate left ventricular wall motion, and estimate left ventricular ejection fraction.  COMPARISON:  None.  FINDINGS: Perfusion: Slight diminished uptake in the inferior wall and inferolateral wall which can be explained on the basis of diaphragmatic attenuation. No evidence of reversibility.  Wall Motion: Normal left ventricular wall motion. No left ventricular dilation.  Left Ventricular Ejection Fraction: 16%  End diastolic volume 96 ml  End systolic volume 29 ml  IMPRESSION: 1. No reversible ischemia or infarction.  2. Normal left ventricular wall motion.  3. Left ventricular ejection fraction 69%.  4. Low-risk  stress test findings*.  *2012 Appropriate Use Criteria for Coronary Revascularization Focused Update: J Am Coll Cardiol. 0737;10(6):269-485. http://content.airportbarriers.com.aspx?articleid=1201161   Electronically Signed   By: Evangeline Dakin M.D.   On: 04/22/2014 12:03   Medications: I have reviewed the patient's current medications. Scheduled Meds: . aspirin EC  81 mg Oral Daily  . carvedilol  6.25 mg Oral BID WC  . enoxaparin (LOVENOX) injection  40 mg Subcutaneous Q24H  . fluorouracil   Topical BID  . insulin aspart  0-9 Units Subcutaneous TID WC  . mupirocin cream   Topical Daily  . simvastatin  40 mg Oral q1800  . sodium chloride  3 mL Intravenous Q12H   Continuous Infusions: . sodium chloride 125 mL/hr at 04/23/14 0517   PRN Meds:acetaminophen, morphine injection, nitroGLYCERIN, oxyCODONE-acetaminophen **AND** oxyCODONE   Assessment/Plan:  New aflutter:  New on EKG yesterday.  Currently in NSR on telemetry.  Cardiology recs  continuing Coreg for rate control and adding coumadin for anticoag (CHADSVASC 4). - Coreg - coumadin per pharmacy; he can continue switch to NOAC at a later time once renal function stabilizes - INR f/u with his PCP or cardiologist - outpatient sleep study and 30 day monitor; will try and get ECHO inpatient  Chest pain: Troponins negative x 6. Seen by cardiology. Lexican NST with no reversible ischemia or infarction, normal left ventricular wall motion, LV EF 69%, low risk stress test findings. Hgb A1c 6.9%, lipid panel wnl. -continue Aspirin 81 mg, NTG prn  -increase Coreg from 6.25 mg BID to 12.5mg  BID,  -continue statin -will need outpatient f/u with PCP in [place] (Dr. Daiva Eves) and cardiology in [facility] [place] (Dr. Cheri Fowler)  AKI: Improving.  Cr 1.09-->2.43-->1.92. Likely 2/2 IV contrast. -NS@125cc /hr -Repeat BMP as outpatient  HTN (hypertension) -continued Coreg, held Cardura 16 mg qhs and Hyzaar 100-25 mg as BP was soft on admission -continue to hold Hyzaar at d/c due to AKI; resume at outpatient follow-up  DM type 2 (diabetes mellitus, type 2) -SSI-S. Held home medications (Metformin and Actos) -resume Actos at d/c; hold metformin given AKI   FEN -H/H diet   DVT ppx  -Lov, scds  Dispo: He is stable for d/c home today after ECHO and initiation of warfarin.  The patient does have a current PCP (Leonides Sake, MD) and does not need an [facility] hospital follow-up appointment after discharge.  The patient does not have transportation limitations that hinder transportation to clinic appointments.  .Services Needed at time of discharge: Y = Yes, Blank = No PT:   OT:   RN:   Equipment:   Other:     LOS: 2 days   Francesca Oman, DO 04/23/2014, 7:13 AM    Reviewed Dr.Wilson's note. Plan of care discussed with her. Thanks, Madilyn Fireman MD 04/23/2014 3:22 PM

## 2014-04-23 NOTE — Progress Notes (Addendum)
Subjective: With intermittent positional chest pain. Pain also worsen with deep breathing and cough. No palpitations.  Objective: Vital signs in last 24 hours: Temp:  [98.2 F (36.8 C)-99.5 F (37.5 C)] 98.2 F (36.8 C) (12/13 0500) Pulse Rate:  [73-93] 90 (12/13 0500) Resp:  [18-20] 18 (12/13 0500) BP: (81-153)/(44-58) 153/55 mmHg (12/13 0810) SpO2:  [95 %-100 %] 97 % (12/13 0500) Weight:  [224 lb 11.2 oz (101.923 kg)] 224 lb 11.2 oz (101.923 kg) (12/13 0500) Last BM Date: 04/20/14  Intake/Output from previous day: 12/12 0701 - 12/13 0700 In: 0  Out: 700 [Urine:700] Intake/Output this shift: Total I/O In: -  Out: 200 [Urine:200]  Medications Current Facility-Administered Medications  Medication Dose Route Frequency Provider Last Rate Last Dose  . 0.9 %  sodium chloride infusion   Intravenous Continuous Jacques Earthly, MD 125 mL/hr at 04/23/14 0517    . acetaminophen (TYLENOL) tablet 650 mg  650 mg Oral Q6H PRN Fransico Meadow, PA-C      . aspirin EC tablet 81 mg  81 mg Oral Daily Cresenciano Genre, MD   81 mg at 04/22/14 1128  . carvedilol (COREG) tablet 6.25 mg  6.25 mg Oral BID WC Cresenciano Genre, MD   6.25 mg at 04/23/14 0813  . enoxaparin (LOVENOX) injection 40 mg  40 mg Subcutaneous Q24H Cresenciano Genre, MD   40 mg at 04/22/14 1402  . fluorouracil (EFUDEX) 5 % cream   Topical BID Jacques Earthly, MD      . insulin aspart (novoLOG) injection 0-9 Units  0-9 Units Subcutaneous TID WC Cresenciano Genre, MD   3 Units at 04/23/14 0813  . morphine 2 MG/ML injection 1 mg  1 mg Intravenous Q4H PRN Cresenciano Genre, MD   1 mg at 04/21/14 1227  . mupirocin cream (BACTROBAN) 2 %   Topical Daily Jacques Earthly, MD      . nitroGLYCERIN (NITROSTAT) SL tablet 0.4 mg  0.4 mg Sublingual Q5 min PRN Fransico Meadow, PA-C      . oxyCODONE-acetaminophen (PERCOCET/ROXICET) 5-325 MG per tablet 1 tablet  1 tablet Oral Q3H PRN Eileen Stanford, PA-C   1 tablet at 04/22/14 1129   And  . oxyCODONE (Oxy  IR/ROXICODONE) immediate release tablet 5 mg  5 mg Oral Q3H PRN Eileen Stanford, PA-C   5 mg at 04/21/14 2046  . simvastatin (ZOCOR) tablet 40 mg  40 mg Oral q1800 Cresenciano Genre, MD   40 mg at 04/22/14 1718  . sodium chloride 0.9 % injection 3 mL  3 mL Intravenous Q12H Cresenciano Genre, MD   3 mL at 04/22/14 1130   Physical Exam: Filed Vitals:   04/22/14 1506 04/22/14 2100 04/23/14 0500 04/23/14 0810  BP: 142/56 104/52 133/58 153/55  Pulse: 85 93 90   Temp: 98.3 F (36.8 C) 99.5 F (37.5 C) 98.2 F (36.8 C)   TempSrc: Oral Oral Oral   Resp: 20 18 18    Height:      Weight:   224 lb 11.2 oz (101.923 kg)   SpO2: 100% 99% 97%     GEN- The patient is overweight appearing, alert and oriented x 3 today.   Head- normocephalic, atraumatic Eyes-  Sclera clear, conjunctiva pink Ears- hearing intact Oropharynx- clear Neck- supple, Lungs- Clear to ausculation bilaterally, normal work of breathing Heart- Regular rate and rhythm with frequent ectopy GI- soft, NT, ND, + BS Extremities- no clubbing, cyanosis, or edema  MS- no significant deformity or atrophy Skin- no rash or lesion Psych- euthymic mood, full affect Neuro- strength and sensation are intact   Lab Results:   Recent Labs  04/21/14 0635  WBC 7.8  HGB 11.7*  HCT 36.1*  PLT 164   BMET  Recent Labs  04/21/14 0635 04/22/14 0347 04/23/14 0236  NA 137 136* 134*  K 4.6 5.0 4.0  CL 99 98 98  CO2 24 18* 23  GLUCOSE 214* 219* 190*  BUN 26* 36* 52*  CREATININE 1.09 2.43* 1.92*  CALCIUM 9.2 9.2 8.2*   PT/INR No results for input(s): LABPROT, INR in the last 72 hours. Cholesterol  Recent Labs  04/21/14 1310  CHOL 129   Cardiac Panel (last 3 results)  Recent Labs  04/22/14 1500 04/22/14 2017 04/23/14 0236  TROPONINI <0.30 <0.30 <0.30     Assessment/Plan  1. Atrial flutter (both typical and atypical)- new problem EKGs from yesterday demonstrate atrial flutter with controlled V rates He is mostly  asymptomatic I would recommend that we continue coreg for rate control chads2vasc score is at least 4. This patients CHA2DS2-VASc Score and unadjusted Ischemic Stroke Rate (% per year) is equal to 4.8 % stroke rate/year from a score of 4  Above score calculated as 1 point each if present [CHF, HTN, DM, Vascular=MI/PAD/Aortic Plaque, Age if 65-74, or Male] Above score calculated as 2 points each if present [Age > 75, or Stroke/TIA/TE]  I would therefore recommend anticoagulation at this time.  Given labile renal function, I would advise coumadin at this time with close INR follow-up.   If renal function stabilizes then a NOAC could be considered at that time. Will need outpatient echo, sleep study, and 30 day monitor at discharge  2. Chest pain- atypical chest pain, with low risk myoview No ekg changes (all ekgs from yesterday are reviewed).  He has RBBB and several ekgs with atrial flutter which obscure ST segment   3. HLD-  Continue statin  4. HTN- BP elevated presently Increase coreg to 12.5mg  BID Would hold hyzaar given renal failure  5. Acute renal failure Slowly improving off hyzaar Will need close outpatient follow-up  OK to discharge from CV standpoint Will arrange outpatient follow-up with Richardson Dopp PA     Thompson Grayer, MD 04/23/2014 8:36 AM

## 2014-04-23 NOTE — Progress Notes (Signed)
  Echocardiogram 2D Echocardiogram has been performed.  Jamie Howard 04/23/2014, 3:57 PM

## 2014-04-24 DIAGNOSIS — E785 Hyperlipidemia, unspecified: Secondary | ICD-10-CM

## 2014-04-24 DIAGNOSIS — I4892 Unspecified atrial flutter: Secondary | ICD-10-CM

## 2014-04-24 LAB — CBC
HCT: 31.7 % — ABNORMAL LOW (ref 39.0–52.0)
Hemoglobin: 10.2 g/dL — ABNORMAL LOW (ref 13.0–17.0)
MCH: 28.5 pg (ref 26.0–34.0)
MCHC: 32.2 g/dL (ref 30.0–36.0)
MCV: 88.5 fL (ref 78.0–100.0)
PLATELETS: 186 10*3/uL (ref 150–400)
RBC: 3.58 MIL/uL — ABNORMAL LOW (ref 4.22–5.81)
RDW: 14.8 % (ref 11.5–15.5)
WBC: 6 10*3/uL (ref 4.0–10.5)

## 2014-04-24 LAB — GLUCOSE, CAPILLARY: Glucose-Capillary: 167 mg/dL — ABNORMAL HIGH (ref 70–99)

## 2014-04-24 LAB — BASIC METABOLIC PANEL
Anion gap: 13 (ref 5–15)
BUN: 29 mg/dL — AB (ref 6–23)
CALCIUM: 9.1 mg/dL (ref 8.4–10.5)
CO2: 23 mEq/L (ref 19–32)
Chloride: 102 mEq/L (ref 96–112)
Creatinine, Ser: 1.08 mg/dL (ref 0.50–1.35)
GFR calc Af Amer: 77 mL/min — ABNORMAL LOW (ref >90)
GFR calc non Af Amer: 66 mL/min — ABNORMAL LOW (ref >90)
GLUCOSE: 188 mg/dL — AB (ref 70–99)
Potassium: 4.1 mEq/L (ref 3.7–5.3)
SODIUM: 138 meq/L (ref 137–147)

## 2014-04-24 LAB — PROTIME-INR
INR: 1.01 (ref 0.00–1.49)
PROTHROMBIN TIME: 13.4 s (ref 11.6–15.2)

## 2014-04-24 MED ORDER — IBUPROFEN 600 MG PO TABS: 600.0000 mg | ORAL_TABLET | Freq: Three times a day (TID) | ORAL | Status: DC

## 2014-04-24 MED ORDER — COLCHICINE 0.6 MG PO TABS: 0.6000 mg | ORAL_TABLET | Freq: Two times a day (BID) | ORAL | Status: AC

## 2014-04-24 MED ORDER — PANTOPRAZOLE SODIUM 40 MG PO TBEC
40.0000 mg | DELAYED_RELEASE_TABLET | Freq: Every day | ORAL | Status: DC
  Administered 2014-04-24: 40 mg via ORAL
  Filled 2014-04-24: qty 1

## 2014-04-24 MED ORDER — WARFARIN SODIUM 7.5 MG PO TABS: 7.5000 mg | ORAL_TABLET | Freq: Once | ORAL | Status: DC

## 2014-04-24 MED ORDER — CARVEDILOL 12.5 MG PO TABS: 12.5000 mg | ORAL_TABLET | Freq: Two times a day (BID) | ORAL | Status: DC

## 2014-04-24 MED ORDER — IBUPROFEN 600 MG PO TABS
600.0000 mg | ORAL_TABLET | Freq: Three times a day (TID) | ORAL | Status: DC
  Administered 2014-04-24: 600 mg via ORAL
  Filled 2014-04-24: qty 1

## 2014-04-24 MED ORDER — WARFARIN SODIUM 7.5 MG PO TABS: 7.5000 mg | ORAL_TABLET | Freq: Every day | ORAL | Status: DC

## 2014-04-24 MED ORDER — PANTOPRAZOLE SODIUM 40 MG PO TBEC: 40.0000 mg | DELAYED_RELEASE_TABLET | Freq: Every day | ORAL | Status: DC

## 2014-04-24 NOTE — Discharge Instructions (Addendum)
°Information on my medicine - Coumadin®   (Warfarin) ° °This medication education was reviewed with me or my healthcare representative as part of my discharge preparation.  The pharmacist that spoke with me during my hospital stay was:  Wilson, Frank Rhea, RPH ° °Why was Coumadin prescribed for you? °Coumadin was prescribed for you because you have a blood clot or a medical condition that can cause an increased risk of forming blood clots. Blood clots can cause serious health problems by blocking the flow of blood to the heart, lung, or brain. Coumadin can prevent harmful blood clots from forming. °As a reminder your indication for Coumadin is:   Stroke Prevention Because Of Atrial Fibrillation ° °What test will check on my response to Coumadin? °While on Coumadin (warfarin) you will need to have an INR test regularly to ensure that your dose is keeping you in the desired range. The INR (international normalized ratio) number is calculated from the result of the laboratory test called prothrombin time (PT). ° °If an INR APPOINTMENT HAS NOT ALREADY BEEN MADE FOR YOU please schedule an appointment to have this lab work done by your health care provider within 7 days. °Your INR goal is usually a number between:  2 to 3 or your provider may give you a more narrow range like 2-2.5.  Ask your health care provider during an office visit what your goal INR is. ° °What  do you need to  know  About  COUMADIN? °Take Coumadin (warfarin) exactly as prescribed by your healthcare provider about the same time each day.  DO NOT stop taking without talking to the doctor who prescribed the medication.  Stopping without other blood clot prevention medication to take the place of Coumadin may increase your risk of developing a new clot or stroke.  Get refills before you run out. ° °What do you do if you miss a dose? °If you miss a dose, take it as soon as you remember on the same day then continue your regularly scheduled regimen the  next day.  Do not take two doses of Coumadin at the same time. ° °Important Safety Information °A possible side effect of Coumadin (Warfarin) is an increased risk of bleeding. You should call your healthcare provider right away if you experience any of the following: °  Bleeding from an injury or your nose that does not stop. °  Unusual colored urine (red or dark brown) or unusual colored stools (red or black). °  Unusual bruising for unknown reasons. °  A serious fall or if you hit your head (even if there is no bleeding). ° °Some foods or medicines interact with Coumadin® (warfarin) and might alter your response to warfarin. To help avoid this: °  Eat a balanced diet, maintaining a consistent amount of Vitamin K. °  Notify your provider about major diet changes you plan to make. °  Avoid alcohol or limit your intake to 1 drink for women and 2 drinks for men per day. °(1 drink is 5 oz. wine, 12 oz. beer, or 1.5 oz. liquor.) ° °Make sure that ANY health care provider who prescribes medication for you knows that you are taking Coumadin (warfarin).  Also make sure the healthcare provider who is monitoring your Coumadin knows when you have started a new medication including herbals and non-prescription products. ° °Coumadin® (Warfarin)  Major Drug Interactions  °Increased Warfarin Effect Decreased Warfarin Effect  °Alcohol (large quantities) °Antibiotics (esp. Septra/Bactrim, Flagyl, Cipro) °Amiodarone (Cordarone) °Aspirin (  naproxen, etc.) Piroxicam (Feldene) Propafenone (Rythmol SR) Propranolol (Inderal) Isoniazid (INH) Posaconazole (Noxafil) Barbiturates (Phenobarbital) Carbamazepine (Tegretol) Chlordiazepoxide (Librium) Cholestyramine (Questran) Griseofulvin Oral Contraceptives Rifampin Sucralfate (Carafate) Vitamin K   Coumadin (Warfarin) Major Herbal Interactions  Increased Warfarin Effect Decreased Warfarin Effect   Garlic Ginseng Ginkgo biloba Coenzyme Q10 Green tea St. Johns wort    Coumadin (Warfarin) FOOD Interactions  Eat a consistent number of servings per week of foods HIGH in Vitamin K (1 serving =  cup)  Collards (cooked, or boiled & drained) Kale (cooked, or boiled & drained) Mustard greens (cooked, or boiled & drained) Parsley *serving size only =  cup Spinach (cooked, or boiled & drained) Swiss chard (cooked, or boiled & drained) Turnip greens (cooked, or boiled & drained)  Eat a consistent number of servings per week of foods MEDIUM-HIGH in Vitamin K (1 serving = 1 cup)  Asparagus (cooked, or boiled & drained) Broccoli (cooked, boiled & drained, or raw & chopped) Brussel sprouts (cooked, or boiled & drained) *serving size only =  cup Lettuce, raw (green leaf, endive, romaine) Spinach, raw Turnip greens, raw & chopped   These websites have more information on Coumadin (warfarin):  FailFactory.se; VeganReport.com.au;    Cardiac Diet A cardiac diet can help stop heart disease or a stroke from happening. It involves eating less unhealthy fats and eating more healthy fats.  FOODS TO AVOID OR LIMIT  Limit saturated fats. This type of fat is found in oils and dairy products, such as:  Coconut oil.  Palm oil.  Cocoa butter.  Butter.  Avoid trans-fat or hydrogenated oils. These are found in fried or pre-made baked goods, such as:  Margarine.  Pre-made cookies, cakes, and crackers.  Limit processed meats (hot dogs, deli meats, sausage) to 3 ounces a week.  Limit high-fat meats (marbled meats, fried chicken, or chicken with skin) to 3 ounces a week.  Limit salt (sodium) to 1500 milligrams a day.   Limit sweets and drinks with added sugar to no more than 5 servings a week. One serving is:  1 tablespoon of sugar.  1 tablespoon of jelly or jam.   cup sorbet.  1 cup lemonade.   cup regular soda. EAT MORE OF THE FOLLOWING  FOODS Fruit  Eat 4to 5 servings a day. One serving of fruit is:  1 medium whole fruit.   cup dried fruit.   cup of fresh, frozen, or canned fruit.   cup 100% fruit juice. Vegetables  Eat 4 to 5 servings a day. One serving is:  1 cup raw leafy vegetables.   cup raw or cooked, cut-up vegetables.   cup vegetable juice. Whole Grains  Eat 3 servings a day (1 ounce equals 1 serving). Legumes (such as beans, peas, and lentils)   Eat at least 4 servings a week ( cup equals 1 serving). Nuts and Seeds   Eat at least 4 servings a week ( cup equals 1 serving). Dietary Fiber  Eat 20 to 30 grams a day. Some foods high in dietary fiber include:  Dried beans.  Citrus fruits.  Apples, bananas.  Broccoli, Brussels sprouts, and eggplant.  Oats. Omega-3 Fats  Eat food with omega-3 fats. You can also take a dietary pill (supplement) that has 1 gram of DHA and EPA. Have 3.5 ounces of fatty fish a week, such as:  Salmon.  Mackerel.  [place] tuna.  Sardines.  Lake trout.  Herring. PREPARING YOUR FOOD  Broil, bake, steam, or roast foods. Do not fry  food. Do not cook food in butter (fat).  Use non-stick cooking sprays.  Remove skin from poultry, such as chicken and [place].  Remove fat from meat.  Take the fat off the top of stews, soups, and gravy.  Use lemon or herbs to flavor food instead of using butter or margarine.  Use nonfat yogurt, salsa, or low-fat dressings for salads. Document Released: 10/28/2011 Document Reviewed: 10/28/2011 [facility] Patient Information 2015 [place]. This information is not intended to replace advice given to you by your health care provider. Make sure you discuss any questions you have with your health care provider.

## 2014-04-24 NOTE — Care Management Note (Signed)
    Page 1 of 1   04/24/2014     10:49:42 AM CARE MANAGEMENT NOTE 04/24/2014  Patient:  ATSUSHI, YOM   Account Number:  [id number]  Date Initiated:  04/24/2014  Documentation initiated by:  GRAVES-BIGELOW,Daisha Filosa  Subjective/Objective Assessment:   Pt admitted for cp. Plan is for d/c home once stable.     Action/Plan:   No needs from CM at this time.   Anticipated DC Date:  04/24/2014   Anticipated DC Plan:  [place]  CM consult      Choice offered to / List presented to:             Status of service:  Completed, signed off Medicare Important Message given?  YES (If response is "NO", the following Medicare IM given date fields will be blank) Date Medicare IM given:  04/24/2014 Medicare IM given by:  GRAVES-BIGELOW,Maaz Spiering Date Additional Medicare IM given:   Additional Medicare IM given by:    Discharge Disposition:  HOME/SELF CARE  Per UR Regulation:  Reviewed for med. necessity/level of care/duration of stay  If discussed at [place] of Stay Meetings, dates discussed:    Comments:

## 2014-04-24 NOTE — Progress Notes (Signed)
Pt for discharge today. He is followed by Dr Atilano Median in [facility]. He will contact Dr Gilman Schmidt office today for follow up office visit and INR check. We will see again as prn.   Kerin Ransom PA-C 04/24/2014 8:46 AM Patient seen and examined and history reviewed. Agree with above findings and plan. Patient maintaining NSR currently. Echo, Myoview, chest CT all reviewed. Stable for DC from cardiac standpoint. Follow up with Dr. Atilano Median in [facility].  Sender Rueb [place], [place] 04/24/2014 10:18 AM

## 2014-04-24 NOTE — Progress Notes (Signed)
[place] for warfarin Indication: atrial flutter  No Known Allergies  Patient Measurements: Height: 5' 10.5" (179.1 cm) Weight: 222 lb 4.8 oz (100.835 kg) IBW/kg (Calculated) : 74.15  Vital Signs: Temp: 98.8 F (37.1 C) (12/14 0556) Temp Source: Oral (12/14 0556) BP: 143/54 mmHg (12/14 0825) Pulse Rate: 69 (12/14 0556)  Labs:  Recent Labs  04/22/14 0347 04/22/14 1500 04/22/14 2017 04/23/14 0236 04/23/14 1053 04/24/14 0341  HGB  --   --   --   --   --  10.2*  HCT  --   --   --   --   --  31.7*  PLT  --   --   --   --   --  186  LABPROT  --   --   --   --  14.0 13.4  INR  --   --   --   --  1.06 1.01  CREATININE 2.43*  --   --  1.92*  --  1.08  TROPONINI  --  <0.30 <0.30 <0.30  --   --     Estimated Creatinine Clearance: 73.1 mL/min (by C-G formula based on Cr of 1.08).   Medical History: Past Medical History  Diagnosis Date  . Diabetes mellitus without complication   . Hypertension   . CAD (coronary artery disease)     a. s/p remote stent ~2002  . HLD (hyperlipidemia)     Medications:  Prescriptions prior to admission  Medication Sig Dispense Refill Last Dose  . aspirin EC 81 MG tablet Take 81 mg by mouth daily.   04/21/2014 at Unknown time  . carvedilol (COREG) 25 MG tablet Take 6.25 mg by mouth 2 (two) times daily with a meal.    04/20/2014 at 2400  . colchicine 0.6 MG tablet Take 0.6 mg by mouth daily as needed (gout).   unknown  . doxazosin (CARDURA) 8 MG tablet Take 16 mg by mouth at bedtime.   04/20/2014 at Unknown time  . fluorouracil (EFUDEX) 5 % cream Apply topically 2 (two) times daily.     Marland Kitchen losartan-hydrochlorothiazide (HYZAAR) 100-25 MG per tablet Take 1 tablet by mouth daily.   04/20/2014 at Unknown time  . metFORMIN (GLUCOPHAGE-XR) 500 MG 24 hr tablet Take 1,000 mg by mouth 2 (two) times daily.   04/20/2014 at Unknown time  . mupirocin ointment (BACTROBAN) 2 % Apply 1 application topically daily. Apply  to wound on back     . pioglitazone (ACTOS) 30 MG tablet Take 30 mg by mouth daily.   04/20/2014 at Unknown time  . simvastatin (ZOCOR) 40 MG tablet Take 40 mg by mouth daily.   04/20/2014 at Unknown time    Assessment: 73 y/o male who presented to the ED with L sided chest pressure found to have atrial flutter. Pharmacy consulted to begin warfarin over NOAC as patient has AKI which is improving. Baseline INR is normal at 1.06. Relatively unchanged this am after one dose of warfarin.   CHADS2VASC is 4. No bleeding noted, Hb is 10.2 and platelets are normal.   Goal of Therapy:  INR 2-3 Monitor platelets by anticoagulation protocol: Yes   Plan:  - Warfarin 7.5 mg PO daily until INR checked, would recommend recheck at end of week - Warfarin book and video ordered - Consider changing to NOAC - Monitor for s/sx of bleeding  Erin Hearing PharmD., BCPS Clinical Pharmacist Pager [phone] 04/24/2014 9:32 AM

## 2014-04-24 NOTE — Progress Notes (Signed)
Subjective: Doing well overall. Improvement in chest pain. No dyspnea or palpitations.  Tolerating diet without nausea/vomiting.  Objective: Vital signs in last 24 hours: Filed Vitals:   04/23/14 1338 04/23/14 2021 04/24/14 0556 04/24/14 0825  BP: 130/51 154/61 154/61 143/54  Pulse: 74 76 69   Temp: 98.7 F (37.1 C) 98.7 F (37.1 C) 98.8 F (37.1 C)   TempSrc: Oral Oral Oral   Resp: 18 18 18    Height:      Weight:   222 lb 4.8 oz (100.835 kg)   SpO2: 99% 99% 98%    Weight change: -2 lb 6.4 oz (-1.089 kg)  Intake/Output Summary (Last 24 hours) at 04/24/14 1020 Last data filed at 04/23/14 1700  Gross per 24 hour  Intake    740 ml  Output      0 ml  Net    740 ml   General: NAD HEENT: [place]/AT Cardiac: RRR, no rubs, murmurs or gallops, no chest wall TTP, distal pulses intact Pulm: clear to auscultation bilaterally, no wheezes, rales, or rhonchi Abd: soft, nontender, nondistended, BS present Ext: warm and well perfused, 1+ pedal edema B/L Neuro: alert and oriented X3, responding appropriately, moving all 4 extremities, 5/5 MMS upper and lower extremities  Lab Results: Basic Metabolic Panel:  Recent Labs Lab 04/21/14 1310  04/23/14 0236 04/24/14 0341  NA  --   < > 134* 138  K  --   < > 4.0 4.1  CL  --   < > 98 102  CO2  --   < > 23 23  GLUCOSE  --   < > 190* 188*  BUN  --   < > 52* 29*  CREATININE  --   < > 1.92* 1.08  CALCIUM  --   < > 8.2* 9.1  MG 1.4*  --   --   --   < > = values in this interval not displayed. Liver Function Tests:  Recent Labs Lab 04/21/14 1310  AST 14  ALT 13  ALKPHOS 53  BILITOT 1.2  PROT 6.6  ALBUMIN 3.6   CBC:  Recent Labs Lab 04/21/14 0635 04/24/14 0341  WBC 7.8 6.0  HGB 11.7* 10.2*  HCT 36.1* 31.7*  MCV 88.9 88.5  PLT 164 186   Cardiac Enzymes:  Recent Labs Lab 04/22/14 1500 04/22/14 2017 04/23/14 0236  TROPONINI <0.30 <0.30 <0.30   BNP:  Recent Labs Lab 04/21/14 0635  PROBNP 213.2*    D-Dimer:  Recent Labs Lab 04/21/14 0635  DDIMER 0.74*   CBG:  Recent Labs Lab 04/22/14 2039 04/23/14 0744 04/23/14 1128 04/23/14 1700 04/23/14 2130 04/24/14 0738  GLUCAP 206* 175* 275* 162* 233* 167*   Hemoglobin A1C:  Recent Labs Lab 04/21/14 1310  HGBA1C 6.9*   Fasting Lipid Panel:  Recent Labs Lab 04/21/14 1310  CHOL 129  HDL 65  LDLCALC 56  TRIG 41  CHOLHDL 2.0   Urine Drug Screen: Drugs of Abuse     Component Value Date/Time   LABOPIA POSITIVE* 04/21/2014 1226   COCAINSCRNUR NONE DETECTED 04/21/2014 1226   LABBENZ NONE DETECTED 04/21/2014 1226   AMPHETMU NONE DETECTED 04/21/2014 1226   THCU NONE DETECTED 04/21/2014 1226   LABBARB NONE DETECTED 04/21/2014 1226    Urinalysis:  Recent Labs Lab 04/21/14 1226  COLORURINE YELLOW  LABSPEC 1.044*  PHURINE 5.0  GLUCOSEU NEGATIVE  HGBUR NEGATIVE  BILIRUBINUR NEGATIVE  KETONESUR NEGATIVE  PROTEINUR NEGATIVE  UROBILINOGEN 1.0  NITRITE NEGATIVE  LEUKOCYTESUR  NEGATIVE    Micro Results: Recent Results (from the past 240 hour(s))  MRSA PCR Screening     Status: None   Collection Time: 04/21/14  1:23 PM  Result Value Ref Range Status   MRSA by PCR NEGATIVE NEGATIVE Final    Comment:        The GeneXpert MRSA Assay (FDA approved for NASAL specimens only), is one component of a comprehensive MRSA colonization surveillance program. It is not intended to diagnose MRSA infection nor to guide or monitor treatment for MRSA infections.    Studies/Results: Nm Myocar Multi W/spect W/wall Motion / Ef  04/22/2014   CLINICAL DATA:  73 year old male presenting yesterday with acute onset left-sided chest pain radiating into the left shoulder and back.  EXAM: MYOCARDIAL IMAGING WITH SPECT (REST AND PHARMACOLOGIC-STRESS)  GATED LEFT VENTRICULAR WALL MOTION STUDY  LEFT VENTRICULAR EJECTION FRACTION  TECHNIQUE: Standard myocardial SPECT imaging was performed after resting intravenous injection of 10 mCi  Tc-69m sestamibi. Subsequently, intravenous infusion of Lexiscan was performed under the supervision of the Cardiology staff. At peak effect of the drug, 30 mCi Tc-30m sestamibi was injected intravenously and standard myocardial SPECT imaging was performed. Quantitative gated imaging was also performed to evaluate left ventricular wall motion, and estimate left ventricular ejection fraction.  COMPARISON:  None.  FINDINGS: Perfusion: Slight diminished uptake in the inferior wall and inferolateral wall which can be explained on the basis of diaphragmatic attenuation. No evidence of reversibility.  Wall Motion: Normal left ventricular wall motion. No left ventricular dilation.  Left Ventricular Ejection Fraction: 36%  End diastolic volume 96 ml  End systolic volume 29 ml  IMPRESSION: 1. No reversible ischemia or infarction.  2. Normal left ventricular wall motion.  3. Left ventricular ejection fraction 69%.  4. Low-risk stress test findings*.  *2012 Appropriate Use Criteria for Coronary Revascularization Focused Update: J Am Coll Cardiol. 1443;15(4):008-676. http://content.airportbarriers.com.aspx?articleid=[email or web address]   Electronically Signed   By: Evangeline Dakin M.D.   On: 04/22/2014 12:03   Medications: I have reviewed the patient's current medications. Scheduled Meds: . aspirin EC  81 mg Oral Daily  . carvedilol  12.5 mg Oral BID WC  . colchicine  0.6 mg Oral BID  . fluorouracil   Topical BID  . ibuprofen  600 mg Oral TID  . insulin aspart  0-9 Units Subcutaneous TID WC  . mupirocin cream   Topical Daily  . pantoprazole  40 mg Oral Daily  . simvastatin  40 mg Oral q1800  . sodium chloride  3 mL Intravenous Q12H  . warfarin  7.5 mg Oral ONCE-1800  . Warfarin - Pharmacist Dosing Inpatient   Does not apply q1800   Continuous Infusions:   PRN Meds:acetaminophen, morphine injection, nitroGLYCERIN, oxyCODONE-acetaminophen **AND** oxyCODONE   Assessment/Plan:  New aflutter:  Cardiology recs  continuing Coreg for rate control and adding coumadin for anticoag (CHADSVASC 4). - Coreg 12.5mg  BID - coumadin per pharmacy; he can consider switch to NOAC at a later time  - INR f/u with his PCP or cardiologist - outpatient sleep study and 30 day monitor  Chest pain: Troponins negative x 6. Seen by cardiology. Lexican NST with no reversible ischemia or infarction, normal left ventricular wall motion, LV EF 69%, low risk stress test findings. Hgb A1c 6.9%, lipid panel wnl. 2D echo with LV EF 50-55%, possible hypokinesis of baso-midinferolateral myocardium. Diastolic function normal. Small pericardial fluid identified. Concern for possible acute pericarditis. -continue Aspirin 81 mg, NTG prn  -continue Coreg 12.5mg  BID -continue  statin -Colchicine 0.6mg  BID for three months, ibuprofen 600mg  TID 1-2 weeks. Will need Cr checked at follow up with PCP. Will also start on Protonix 40mg  daily while on scheduled ibuprofen. -will need outpatient f/u with PCP in [facility] (Dr. Daiva Eves) and cardiology in [facility] (Dr. Atilano Median)  AKI: Resolved.  Cr 1.09-->2.43-->1.92-->1.08. Likely 2/2 IV contrast. -d/c NS@125cc /hr -Repeat BMP as outpatient  HTN (hypertension) -continued Coreg, held Cardura 16 mg qhs and Hyzaar 100-25 mg as BP was soft on admission  DM type 2 (diabetes mellitus, type 2) -SSI-S. Held home medications (Metformin and Actos)  FEN -H/H diet   DVT ppx  -warfarin, scds  Dispo: Likely home today  The patient does have a current PCP (Maura L Hamrick, MD) and does not need an [facility] hospital follow-up appointment after discharge.  The patient does not have transportation limitations that hinder transportation to clinic appointments.  .Services Needed at time of discharge: Y = Yes, Blank = No PT:   OT:   RN:   Equipment:   Other:     LOS: 3 days   Jacques Earthly, MD 04/24/2014, 10:20 AM

## 2014-04-24 NOTE — Discharge Summary (Signed)
Name: Jamie Howard MRN: [id number] DOB: 1940/11/23 73 y.o. PCP: Leonides Sake, MD  Date of Admission: 04/21/2014  6:10 AM Date of Discharge: 04/24/2014 Attending Physician: Madilyn Fireman, MD  Discharge Diagnosis: Principal Problem:   Chest pain Active Problems:   DM type 2 (diabetes mellitus, type 2)   CAD (coronary artery disease)   HTN (hypertension)   HLD (hyperlipidemia)   Hypertension   Diabetes mellitus without complication   Pain in the chest   Pain  Discharge Medications:   Medication List    TAKE these medications        aspirin EC 81 MG tablet  Take 81 mg by mouth daily.     carvedilol 12.5 MG tablet  Commonly known as:  COREG  Take 1 tablet (12.5 mg total) by mouth 2 (two) times daily with a meal.     colchicine 0.6 MG tablet  Take 1 tablet (0.6 mg total) by mouth 2 (two) times daily.     doxazosin 8 MG tablet  Commonly known as:  CARDURA  Take 16 mg by mouth at bedtime.     fluorouracil 5 % cream  Commonly known as:  EFUDEX  Apply topically 2 (two) times daily.     ibuprofen 600 MG tablet  Commonly known as:  ADVIL,MOTRIN  Take 1 tablet (600 mg total) by mouth 3 (three) times daily.     losartan-hydrochlorothiazide 100-25 MG per tablet  Commonly known as:  HYZAAR  Take 1 tablet by mouth daily.     metFORMIN 500 MG 24 hr tablet  Commonly known as:  GLUCOPHAGE-XR  Take 1,000 mg by mouth 2 (two) times daily.     mupirocin ointment 2 %  Commonly known as:  BACTROBAN  Apply 1 application topically daily. Apply to wound on back     pantoprazole 40 MG tablet  Commonly known as:  PROTONIX  Take 1 tablet (40 mg total) by mouth daily.     pioglitazone 30 MG tablet  Commonly known as:  ACTOS  Take 30 mg by mouth daily.     simvastatin 40 MG tablet  Commonly known as:  ZOCOR  Take 40 mg by mouth daily.     warfarin 7.5 MG tablet  Commonly known as:  COUMADIN  Take 1 tablet (7.5 mg total) by mouth daily.        Disposition  and follow-up:   Mr.Seve Vail Vuncannon was discharged from [facility] in Stable condition.  At the hospital follow up visit please address:  1.  Pericarditis: Started on Colchicine 0.6mg  BID for three months, ibuprofen 600mg  TID 1-2 weeks, and Protonix 40mg  daily while on scheduled ibuprofen. New atrial flutter: Cardiology recs continuing Coreg for rate control and adding coumadin for anticoag (CHADSVASC 4). Will need outpatient sleep study and 30 day monitor  2.  Labs / imaging needed at time of follow-up: BMP, PT/INR  3.  Pending labs/ test needing follow-up: None  Follow-up Appointments: Follow-up Information    Follow up with Cheek, Gayleen Orem, MD. Go on 04/27/2014.   Specialty:  Cardiology   Why:  2PM for INR check and follow up appointment   Contact information:   [address] [place] [address] [phone]       Follow up with Leonides Sake, MD. Go on 05/02/2014.   Specialty:  Family Medicine   Why:  11:15AM for hospital follow up   Contact information:   Dr. Cristela Blue [address]  [place] [address] [phone]       Discharge Instructions: Discharge Instructions    Call MD for:  difficulty breathing, headache or visual disturbances    Complete by:  As directed      Call MD for:  persistant dizziness or light-headedness    Complete by:  As directed      Call MD for:  persistant nausea and vomiting    Complete by:  As directed      Call MD for:  severe uncontrolled pain    Complete by:  As directed      Call MD for:  temperature >100.4    Complete by:  As directed      Diet - low sodium heart healthy    Complete by:  As directed      Increase activity slowly    Complete by:  As directed            Consultations: Treatment Team:  Rounding Lbcardiology, MD  Procedures Performed:  Dg Chest 2 View  04/21/2014   CLINICAL DATA:  73 year old male awoke with left upper chest pain. Initial encounter.   EXAM: CHEST  2 VIEW  COMPARISON:  None.  FINDINGS: Mildly low lung volumes. Normal cardiac size and mediastinal contours. Visualized tracheal air column is within normal limits. No pneumothorax, pulmonary edema, pleural effusion or confluent pulmonary opacity. No acute osseous abnormality identified.  IMPRESSION: No acute cardiopulmonary abnormality.   Electronically Signed   By: Lars Pinks M.D.   On: 04/21/2014 07:30   Ct Angio Chest Pe W/cm &/or Wo Cm  04/21/2014   CLINICAL DATA:  73 year old male with acute left chest pain beginning at 0400 hrs. Pain radiating to the left shoulder and back. 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 Initial encounter.  EXAM: CT ANGIOGRAPHY CHEST WITH CONTRAST  TECHNIQUE: Multidetector CT imaging of the chest was performed using the standard protocol during bolus administration of intravenous contrast. Multiplanar CT image reconstructions and MIPs were obtained to evaluate the vascular anatomy.  CONTRAST:  37mL OMNIPAQUE IOHEXOL 350 MG/ML SOLN  COMPARISON:  Chest radiographs 0705 hr today.  FINDINGS: Adequate contrast bolus timing in the pulmonary arterial tree. Respiratory motion, especially at the lung bases. No focal filling defect identified in the pulmonary arterial tree to suggest the presence of acute pulmonary embolism.  Major airways are patent, except for some atelectatic changes. Mild linear in dependent opacity in both lung bases most resembles atelectasis.  Mediastinal lipomatosis. Mild to moderate cardiomegaly. No pericardial or pleural effusion. Widespread calcified atherosclerosis of the aorta and its branches, including coronary artery calcified plaque. No thoracic aortic dissection or aneurysm. No mediastinal or hilar lymphadenopathy. No axillary lymphadenopathy. Negative thoracic inlet.  Negative visualized liver, gallbladder, spleen, pancreas, adrenal glands, and bowel in the upper abdomen.  Osteopenia. No acute osseous abnormality identified.  Review of  the MIP images confirms the above findings.  IMPRESSION: 1. No evidence of acute pulmonary embolus. 2. No thoracic aortic dissection or aneurysm. Calcified plaque, including calcified coronary artery plaque. 3. Pulmonary atelectasis.  Mild to moderate cardiomegaly.   Electronically Signed   By: Lars Pinks M.D.   On: 04/21/2014 09:29   Nm Myocar Multi W/spect W/wall Motion / Ef  04/22/2014   CLINICAL DATA:  73 year old male presenting yesterday with acute onset left-sided chest pain radiating into the left shoulder and back.  EXAM: MYOCARDIAL IMAGING WITH SPECT (REST AND PHARMACOLOGIC-STRESS)  GATED LEFT VENTRICULAR WALL MOTION STUDY  LEFT VENTRICULAR  EJECTION FRACTION  TECHNIQUE: Standard myocardial SPECT imaging was performed after resting intravenous injection of 10 mCi Tc-76m sestamibi. Subsequently, intravenous infusion of Lexiscan was performed under the supervision of the Cardiology staff. At peak effect of the drug, 30 mCi Tc-86m sestamibi was injected intravenously and standard myocardial SPECT imaging was performed. Quantitative gated imaging was also performed to evaluate left ventricular wall motion, and estimate left ventricular ejection fraction.  COMPARISON:  None.  FINDINGS: Perfusion: Slight diminished uptake in the inferior wall and inferolateral wall which can be explained on the basis of diaphragmatic attenuation. No evidence of reversibility.  Wall Motion: Normal left ventricular wall motion. No left ventricular dilation.  Left Ventricular Ejection Fraction: 25%  End diastolic volume 96 ml  End systolic volume 29 ml  IMPRESSION: 1. No reversible ischemia or infarction.  2. Normal left ventricular wall motion.  3. Left ventricular ejection fraction 69%.  4. Low-risk stress test findings*.  *2012 Appropriate Use Criteria for Coronary Revascularization Focused Update: J Am Coll Cardiol. 4270;62(3):762-831. http://content.airportbarriers.com.aspx?articleid=1201161   Electronically Signed   By:  Evangeline Dakin M.D.   On: 04/22/2014 12:03    Echo:  Study Conclusions  - Left ventricle: The cavity size was normal. Wall thickness was normal. Systolic function was normal. The estimated ejection fraction was in the range of 50% to 55%. Possible hypokinesis of the basal-midinferolateral myocardium. Left ventricular diastolic function parameters were normal. - Left atrium: The atrium was mildly dilated. - Pericardium, extracardiac: A small pericardial effusion was identified circumferential to the heart. The fluid had no internal echoes.There was no evidence of hemodynamic compromise.  Transthoracic echocardiography. M-mode, complete 2D, spectral Doppler, and color Doppler. Birthdate: Patient birthdate: 30-Dec-1940. Age: Patient is 73 yr old. Sex: Gender: male. BMI: 32.1 kg/m^2. Blood pressure:   130/51 Patient status: Inpatient. Study date: Study date: 04/23/2014. Study time: 03:19 PM. Location: Bedside.   Admission HPI: 73 y.o PMH DM, HTN, CAD s/p stent (no cath in the last 13 years). He presents with left chest pain radiating to his back (left shoulder blade)without relief with NTG x 2. Chest pain woke him up at 4 am and felt inward "pressure-like" and like "broken sharp bones sticking into each other." Nothing made it worse (except for deep breathing) and nothing makes it better. Pain was constant since onset.Pain was 6-7/10 initially and now 4-5/10 and still present though given Aspirin 324 mg, Tylenol 650 mg x 1, Dilaudid 1 mg x 1, Morphine 4 mg x 1, and NTG in the ED. He denies nausea/vomiting, sob, sweating, radiation to left arm. He denies heart burn or anxiety. This chest pain is a different feeling than 13 years ago when he had a stent. Now he reports a "crampy" sensation in his neck. D-dimer was slightly elevated in the ED. CT chest was done which was negative for PE, aortic dissection but had atelectasis and mild to moderate cardiomegaly.    Hospital Course by problem list: Principal Problem:   Chest pain Active Problems:   DM type 2 (diabetes mellitus, type 2)   CAD (coronary artery disease)   HTN (hypertension)   HLD (hyperlipidemia)   Hypertension   Diabetes mellitus without complication   Pain in the chest   Pain   1. Chest pain, New A flutter, acute pericarditis: He was admitted. He was seen in consultation by cardiology. Troponins were negative x 6 and repeat EKG in AM demonstrated new atrial flutter. CT neg for PE or aortic dissection. He was continued on Aspirin  81 mg, NTG prn, Coreg 6.25 mg bid, statin. He was started on Coumadin as his CHADSVASC is 4 and his Coreg was increased to 12.5mg  BID. Lexican NST with no reversible ischemia or infarction, normal left ventricular wall motion, LV EF 69%, low risk stress test findings. Hgb A1c 6.9%, lipid panel wnl. 2D echo with LV EF 50-55%, possible hypokinesis of baso-midinferolateral myocardium. Diastolic function normal. Small pericardial fluid identified. He was started on Colchicine 0.6mg  BID for three months, ibuprofen 600mg  TID 1-2 weeks, Protonix 40mg  daily while on scheduled ibuprofen. Will need Cr checked at follow up with PCP.  2. HTN (hypertension): continued Coreg, held Cardura 16 mg qhs and Hyzaar 100-25 mg as BP was soft on admission  3. DM type 2 (diabetes mellitus, type 2): His home metformin and Actos were held during hospitalization and he was placed on sliding scale insulin with monitoring of his blood glucoses.   Discharge Vitals:   BP 143/54 mmHg  Pulse 69  Temp(Src) 98.8 F (37.1 C) (Oral)  Resp 18  Ht 5' 10.5" (1.791 m)  Wt 222 lb 4.8 oz (100.835 kg)  BMI 31.44 kg/m2  SpO2 98%  Discharge Labs:  Results for orders placed or performed during the hospital encounter of 04/21/14 (from the past 24 hour(s))  Protime-INR     Status: None   Collection Time: 04/23/14 10:53 AM  Result Value Ref Range   Prothrombin Time 14.0 11.6 - 15.2 seconds   INR  1.06 0.00 - 1.49  Glucose, capillary     Status: Abnormal   Collection Time: 04/23/14 11:28 AM  Result Value Ref Range   Glucose-Capillary 275 (H) 70 - 99 mg/dL  Glucose, capillary     Status: Abnormal   Collection Time: 04/23/14  5:00 PM  Result Value Ref Range   Glucose-Capillary 162 (H) 70 - 99 mg/dL  Glucose, capillary     Status: Abnormal   Collection Time: 04/23/14  9:30 PM  Result Value Ref Range   Glucose-Capillary 233 (H) 70 - 99 mg/dL  Protime-INR     Status: None   Collection Time: 04/24/14  3:41 AM  Result Value Ref Range   Prothrombin Time 13.4 11.6 - 15.2 seconds   INR 1.01 0.00 - 1.61  Basic metabolic panel     Status: Abnormal   Collection Time: 04/24/14  3:41 AM  Result Value Ref Range   Sodium 138 137 - 147 mEq/L   Potassium 4.1 3.7 - 5.3 mEq/L   Chloride 102 96 - 112 mEq/L   CO2 23 19 - 32 mEq/L   Glucose, Bld 188 (H) 70 - 99 mg/dL   BUN 29 (H) 6 - 23 mg/dL   Creatinine, Ser 1.08 0.50 - 1.35 mg/dL   Calcium 9.1 8.4 - 10.5 mg/dL   GFR calc non Af Amer 66 (L) >90 mL/min   GFR calc Af Amer 77 (L) >90 mL/min   Anion gap 13 5 - 15  CBC     Status: Abnormal   Collection Time: 04/24/14  3:41 AM  Result Value Ref Range   WBC 6.0 4.0 - 10.5 K/uL   RBC 3.58 (L) 4.22 - 5.81 MIL/uL   Hemoglobin 10.2 (L) 13.0 - 17.0 g/dL   HCT 31.7 (L) 39.0 - 52.0 %   MCV 88.5 78.0 - 100.0 fL   MCH 28.5 26.0 - 34.0 pg   MCHC 32.2 30.0 - 36.0 g/dL   RDW 14.8 11.5 - 15.5 %   Platelets 186 150 -  400 K/uL  Glucose, capillary     Status: Abnormal   Collection Time: 04/24/14  7:38 AM  Result Value Ref Range   Glucose-Capillary 167 (H) 70 - 99 mg/dL   Comment 1 Notify RN     Signed: Jacques Earthly, MD 04/24/2014, 10:21 AM    Services Ordered on Discharge: None Equipment Ordered on Discharge: None

## 2014-04-24 NOTE — Progress Notes (Signed)
Inpatient Diabetes Program Recommendations  AACE/ADA: New Consensus Statement on Inpatient Glycemic Control (2013)  Target Ranges:  Prepandial:   less than 140 mg/dL      Peak postprandial:   less than 180 mg/dL (1-2 hours)      Critically ill patients:  140 - 180 mg/dL     Results for Jamie Howard, Jamie Howard (MRN [id number]) as of 04/24/2014 07:56  Ref. Range 04/23/2014 07:44 04/23/2014 11:28 04/23/2014 17:00 04/23/2014 21:30  Glucose-Capillary Latest Range: 70-99 mg/dL 175 (H) 275 (H) 162 (H) 233 (H)     Admitted with CP.  History of DM, HTN, CAD.   Home DM Meds: Metformin 1000 mg bid       Actos 30 mg daily   Current Insulin Orders: Novolog Sensitive SSI tid   **Patient having some issues with elevated postprandial glucose levels.    **Receiving solid PO diet.    MD- Please consider adding Novolog Meal Coverage-  Novolog 4 units tid with meals     Will follow Wyn Quaker RN, MSN, CDE Diabetes Coordinator Inpatient Diabetes Program Team Pager: [phone] (8a-10p)

## 2014-04-24 NOTE — Progress Notes (Signed)
Pt seen and examined with Dr. Randell Patient. Please refer to resident note for details.  Pt is doing better today. CP is improving.  Plan:  Aflutter: - c/w coreg. - Started on coumadin. Will need repeat INR check with his PCP or cardiologist this week  CP: - likely pericarditis - c/w colchicine for 3 months. Started on ibuprofen today (will need 1-2 weeks) - Outpatient cardiology f/u  AKI: - Resolved. Will need repeat BMP as an outpatient   Pt is stable for d/c home today. He is to f/u with his PCP and his cardiologist in the next week or 2

## 2014-04-27 DIAGNOSIS — I2510 Atherosclerotic heart disease of native coronary artery without angina pectoris: Secondary | ICD-10-CM | POA: Diagnosis not present

## 2014-04-27 DIAGNOSIS — E780 Pure hypercholesterolemia: Secondary | ICD-10-CM | POA: Diagnosis not present

## 2014-04-27 DIAGNOSIS — I4892 Unspecified atrial flutter: Secondary | ICD-10-CM | POA: Diagnosis not present

## 2014-04-27 DIAGNOSIS — I319 Disease of pericardium, unspecified: Secondary | ICD-10-CM | POA: Diagnosis not present

## 2014-04-27 DIAGNOSIS — I10 Essential (primary) hypertension: Secondary | ICD-10-CM | POA: Diagnosis not present

## 2014-05-02 DIAGNOSIS — R0789 Other chest pain: Secondary | ICD-10-CM | POA: Diagnosis not present

## 2014-05-02 DIAGNOSIS — N289 Disorder of kidney and ureter, unspecified: Secondary | ICD-10-CM | POA: Diagnosis not present

## 2014-05-02 DIAGNOSIS — I499 Cardiac arrhythmia, unspecified: Secondary | ICD-10-CM | POA: Diagnosis not present

## 2014-05-02 DIAGNOSIS — R0600 Dyspnea, unspecified: Secondary | ICD-10-CM | POA: Diagnosis not present

## 2014-05-02 DIAGNOSIS — R600 Localized edema: Secondary | ICD-10-CM | POA: Diagnosis not present

## 2014-05-02 DIAGNOSIS — Z7901 Long term (current) use of anticoagulants: Secondary | ICD-10-CM | POA: Diagnosis not present

## 2014-05-08 DIAGNOSIS — R5383 Other fatigue: Secondary | ICD-10-CM | POA: Diagnosis not present

## 2014-05-08 DIAGNOSIS — R413 Other amnesia: Secondary | ICD-10-CM | POA: Diagnosis not present

## 2014-05-08 DIAGNOSIS — N182 Chronic kidney disease, stage 2 (mild): Secondary | ICD-10-CM | POA: Diagnosis not present

## 2014-05-09 DIAGNOSIS — I4892 Unspecified atrial flutter: Secondary | ICD-10-CM | POA: Diagnosis not present

## 2014-05-10 DIAGNOSIS — I4891 Unspecified atrial fibrillation: Secondary | ICD-10-CM | POA: Diagnosis not present

## 2014-05-10 DIAGNOSIS — E538 Deficiency of other specified B group vitamins: Secondary | ICD-10-CM | POA: Diagnosis not present

## 2014-05-10 DIAGNOSIS — I10 Essential (primary) hypertension: Secondary | ICD-10-CM | POA: Diagnosis not present

## 2014-05-10 DIAGNOSIS — D649 Anemia, unspecified: Secondary | ICD-10-CM | POA: Diagnosis not present

## 2014-05-11 DIAGNOSIS — I4892 Unspecified atrial flutter: Secondary | ICD-10-CM | POA: Diagnosis not present

## 2014-05-15 DIAGNOSIS — I4892 Unspecified atrial flutter: Secondary | ICD-10-CM | POA: Diagnosis not present

## 2014-05-16 ENCOUNTER — Other Ambulatory Visit (HOSPITAL_COMMUNITY): Payer: Medicare Other

## 2014-05-16 ENCOUNTER — Ambulatory Visit: Payer: Medicare Other | Admitting: Physician Assistant

## 2014-05-17 DIAGNOSIS — R531 Weakness: Secondary | ICD-10-CM | POA: Diagnosis not present

## 2014-05-17 DIAGNOSIS — D649 Anemia, unspecified: Secondary | ICD-10-CM | POA: Diagnosis not present

## 2014-05-17 DIAGNOSIS — N289 Disorder of kidney and ureter, unspecified: Secondary | ICD-10-CM | POA: Diagnosis not present

## 2014-05-22 DIAGNOSIS — I4892 Unspecified atrial flutter: Secondary | ICD-10-CM | POA: Diagnosis not present

## 2014-05-25 DIAGNOSIS — I4892 Unspecified atrial flutter: Secondary | ICD-10-CM | POA: Diagnosis not present

## 2014-05-25 DIAGNOSIS — I2510 Atherosclerotic heart disease of native coronary artery without angina pectoris: Secondary | ICD-10-CM | POA: Diagnosis not present

## 2014-05-27 DIAGNOSIS — I4892 Unspecified atrial flutter: Secondary | ICD-10-CM | POA: Diagnosis not present

## 2014-05-29 DIAGNOSIS — I4892 Unspecified atrial flutter: Secondary | ICD-10-CM | POA: Diagnosis not present

## 2014-06-12 DIAGNOSIS — I4892 Unspecified atrial flutter: Secondary | ICD-10-CM | POA: Diagnosis not present

## 2014-06-15 DIAGNOSIS — I10 Essential (primary) hypertension: Secondary | ICD-10-CM | POA: Diagnosis not present

## 2014-06-15 DIAGNOSIS — I2510 Atherosclerotic heart disease of native coronary artery without angina pectoris: Secondary | ICD-10-CM | POA: Diagnosis not present

## 2014-06-15 DIAGNOSIS — I300 Acute nonspecific idiopathic pericarditis: Secondary | ICD-10-CM | POA: Diagnosis not present

## 2014-06-15 DIAGNOSIS — I4892 Unspecified atrial flutter: Secondary | ICD-10-CM | POA: Diagnosis not present

## 2014-06-17 ENCOUNTER — Other Ambulatory Visit: Payer: Self-pay | Admitting: Pulmonary Disease

## 2014-06-27 DIAGNOSIS — Z86010 Personal history of colonic polyps: Secondary | ICD-10-CM | POA: Diagnosis not present

## 2014-07-19 DIAGNOSIS — E119 Type 2 diabetes mellitus without complications: Secondary | ICD-10-CM | POA: Diagnosis not present

## 2014-07-19 DIAGNOSIS — K635 Polyp of colon: Secondary | ICD-10-CM | POA: Diagnosis not present

## 2014-07-19 DIAGNOSIS — Z01818 Encounter for other preprocedural examination: Secondary | ICD-10-CM | POA: Diagnosis not present

## 2014-07-22 DIAGNOSIS — D369 Benign neoplasm, unspecified site: Secondary | ICD-10-CM | POA: Diagnosis not present

## 2014-07-31 DIAGNOSIS — D1801 Hemangioma of skin and subcutaneous tissue: Secondary | ICD-10-CM | POA: Diagnosis not present

## 2014-07-31 DIAGNOSIS — Z85820 Personal history of malignant melanoma of skin: Secondary | ICD-10-CM | POA: Diagnosis not present

## 2014-07-31 DIAGNOSIS — L570 Actinic keratosis: Secondary | ICD-10-CM | POA: Diagnosis not present

## 2014-07-31 DIAGNOSIS — L821 Other seborrheic keratosis: Secondary | ICD-10-CM | POA: Diagnosis not present

## 2014-08-01 DIAGNOSIS — E538 Deficiency of other specified B group vitamins: Secondary | ICD-10-CM | POA: Diagnosis not present

## 2014-08-01 DIAGNOSIS — M109 Gout, unspecified: Secondary | ICD-10-CM | POA: Diagnosis not present

## 2014-08-01 DIAGNOSIS — E780 Pure hypercholesterolemia: Secondary | ICD-10-CM | POA: Diagnosis not present

## 2014-08-01 DIAGNOSIS — D649 Anemia, unspecified: Secondary | ICD-10-CM | POA: Diagnosis not present

## 2014-08-01 DIAGNOSIS — E119 Type 2 diabetes mellitus without complications: Secondary | ICD-10-CM | POA: Diagnosis not present

## 2014-08-02 DIAGNOSIS — Z86010 Personal history of colonic polyps: Secondary | ICD-10-CM | POA: Diagnosis not present

## 2014-08-15 DIAGNOSIS — I10 Essential (primary) hypertension: Secondary | ICD-10-CM | POA: Diagnosis not present

## 2014-08-15 DIAGNOSIS — N182 Chronic kidney disease, stage 2 (mild): Secondary | ICD-10-CM | POA: Diagnosis not present

## 2014-08-15 DIAGNOSIS — Z6830 Body mass index (BMI) 30.0-30.9, adult: Secondary | ICD-10-CM | POA: Diagnosis not present

## 2014-08-15 DIAGNOSIS — D649 Anemia, unspecified: Secondary | ICD-10-CM | POA: Diagnosis not present

## 2014-08-15 DIAGNOSIS — M109 Gout, unspecified: Secondary | ICD-10-CM | POA: Diagnosis not present

## 2014-08-15 DIAGNOSIS — Z1389 Encounter for screening for other disorder: Secondary | ICD-10-CM | POA: Diagnosis not present

## 2014-08-15 DIAGNOSIS — E1165 Type 2 diabetes mellitus with hyperglycemia: Secondary | ICD-10-CM | POA: Diagnosis not present

## 2014-08-15 DIAGNOSIS — E538 Deficiency of other specified B group vitamins: Secondary | ICD-10-CM | POA: Diagnosis not present

## 2014-08-18 ENCOUNTER — Ambulatory Visit (HOSPITAL_BASED_OUTPATIENT_CLINIC_OR_DEPARTMENT_OTHER): Payer: Medicare Other | Attending: Physician Assistant

## 2014-08-18 VITALS — Ht 70.5 in | Wt 217.0 lb

## 2014-08-18 DIAGNOSIS — G4733 Obstructive sleep apnea (adult) (pediatric): Secondary | ICD-10-CM

## 2014-08-26 DIAGNOSIS — G4733 Obstructive sleep apnea (adult) (pediatric): Secondary | ICD-10-CM | POA: Diagnosis not present

## 2014-08-26 NOTE — Sleep Study (Signed)
   NAME: Jamie Howard DATE OF BIRTH:  November 17, 1940 MEDICAL RECORD NUMBER [id number]  LOCATION: [facility]  PHYSICIAN: YOUNG,CLINTON D  DATE OF STUDY: 08/18/2014  SLEEP STUDY TYPE: Nocturnal Polysomnogram               REFERRING PHYSICIAN: Cyndi Bender, PA-C  INDICATION FOR STUDY: Hypersomnia with sleep apnea  EPWORTH SLEEPINESS SCORE:   11/24 HEIGHT: 5' 10.5" (179.1 cm)  WEIGHT: 217 lb (98.431 kg)    Body mass index is 30.69 kg/(m^2).  NECK SIZE: 17.5 in.  MEDICATIONS: Charted for review  SLEEP ARCHITECTURE: Total sleep time 251.5 minutes with sleep efficiency 67.2%. Stage I was 12.9%, stage II 76.9%, stage III absent, REM 10.1% of total sleep time. Sleep latency 21.5 minutes, REM latency 87.5 minutes, awake after sleep onset 100 to minutes, arousal index 9.8, bedtime medication: Cardura, Zocor  RESPIRATORY DATA: Apnea hypopnea index (AHI) 30.5 per hour. 128 events were scored including 49 obstructive apneas and 78 hypopneas. Most events were while supine. REM AHI 54.1 per hour. CPAP titration was not ordered.  OXYGEN DATA: Moderate snoring with oxygen desaturation to a nadir of 84% and mean saturation 94.7% on room air  CARDIAC DATA: Sinus rhythm with PACs  MOVEMENT/PARASOMNIA: 153 limb jerks were counted of which only 2 were identified with arousal or awakening for a periodic limb movement with arousal index of 0.5 per hour. Bathroom 2  IMPRESSION/ RECOMMENDATION:   1) Severe obstructive sleep apnea/hypopnea syndrome, AHI 30.5 per hour. Events more common while supine. REM AHI 54.1 per hour. Moderate snoring with oxygen desaturation to a nadir of 84% and mean saturation 94.7% on room air. 2) This study was ordered as a diagnostic polysomnogram without CPAP. Most events developed after 3 AM so split CPAP protocol could not have been applied on this study night. The patient can return for a dedicated CPAP titration protocol study if desired.  3) Frequent  limb movement was seen before most apnea events developed, before 3 AM. It was associated with little evident sleep disturbance. If limb movement sleep disturbance is clinically important in the home environment after adjustment to CPAP, then a trial specific therapy such as Requip or Mirapex might be a consideration if appropriate.  Deneise Lever Diplomate, American Board of Sleep Medicine  ELECTRONICALLY SIGNED ON:  08/26/2014, 10:03 AM [place] PH: [phone]   FX: [phone] [place]

## 2014-09-18 DIAGNOSIS — H40053 Ocular hypertension, bilateral: Secondary | ICD-10-CM | POA: Diagnosis not present

## 2014-09-18 DIAGNOSIS — H2513 Age-related nuclear cataract, bilateral: Secondary | ICD-10-CM | POA: Diagnosis not present

## 2014-09-18 DIAGNOSIS — H25013 Cortical age-related cataract, bilateral: Secondary | ICD-10-CM | POA: Diagnosis not present

## 2014-09-18 DIAGNOSIS — E119 Type 2 diabetes mellitus without complications: Secondary | ICD-10-CM | POA: Diagnosis not present

## 2014-09-22 DIAGNOSIS — H2513 Age-related nuclear cataract, bilateral: Secondary | ICD-10-CM | POA: Diagnosis not present

## 2014-09-22 DIAGNOSIS — H40053 Ocular hypertension, bilateral: Secondary | ICD-10-CM | POA: Diagnosis not present

## 2014-09-22 DIAGNOSIS — H25013 Cortical age-related cataract, bilateral: Secondary | ICD-10-CM | POA: Diagnosis not present

## 2014-09-25 ENCOUNTER — Ambulatory Visit (HOSPITAL_BASED_OUTPATIENT_CLINIC_OR_DEPARTMENT_OTHER): Payer: Medicare Other | Attending: Family Medicine

## 2014-09-25 VITALS — Ht 70.5 in | Wt 218.0 lb

## 2014-09-25 DIAGNOSIS — G4710 Hypersomnia, unspecified: Secondary | ICD-10-CM | POA: Insufficient documentation

## 2014-09-25 DIAGNOSIS — G4730 Sleep apnea, unspecified: Secondary | ICD-10-CM | POA: Insufficient documentation

## 2014-09-25 DIAGNOSIS — G4733 Obstructive sleep apnea (adult) (pediatric): Secondary | ICD-10-CM

## 2014-10-01 NOTE — Sleep Study (Signed)
   NAME: Jamie Howard DATE OF BIRTH:  03/17/1941 MEDICAL RECORD NUMBER [id number]  LOCATION: [facility]  PHYSICIAN: YOUNG,CLINTON D  DATE OF STUDY: 09/25/2014  SLEEP STUDY TYPE: Nocturnal Polysomnogram               REFERRING PHYSICIAN: Hamrick, Maura L, MD  INDICATION FOR STUDY: Hypersomnia with sleep apnea-CPAP titration  EPWORTH SLEEPINESS SCORE:   11/24 HEIGHT: 5' 10.5" (179.1 cm)  WEIGHT: 98.884 kg (218 lb)    Body mass index is 30.83 kg/(m^2).  NECK SIZE: 17.5 in.  MEDICATIONS: Charted for review  SLEEP ARCHITECTURE: Total sleep time 277.5 minutes with sleep efficiency 69.9%. Stage I was 12.4%, stage II 69.7%, stage III 0.5%, REM 17.3% of total sleep time. Sleep latency 25 minutes, REM latency 86.5 minutes, awake after sleep onset 94.5 minutes, arousal index 6.7, bedtime medication: Cardura, Zocor  RESPIRATORY DATA: CPAP titration protocol. CPAP titrated to 11 CWP, AHI 1.8 per hour. He wore a small fullface mask.   OXYGEN DATA: Snoring was prevented at final CPAP pressure with mean oxygen saturation 95.3% on room air  CARDIAC DATA: Sinus rhythm with very frequent PACs and intervals suggesting atrial fibrillation on single-lead EKG  MOVEMENT/PARASOMNIA: Frequent limb jerks. 251 limb jerks were counted, none of which were associated with awakening or arousal. Bathroom 3  IMPRESSION/ RECOMMENDATION:   1) Successful CPAP titration to 11 CWP, AHI 1.8 per hour. He wore a small F&P Simplus fullface mask with larger headgear. Snoring was prevented and mean oxygen saturation was 95.3% on room air. 2) Frequent limb jerks without associated sleep disturbance identified. Bathroom 3, representing additional sleep disturbance. 3) Baseline polysomnogram on 08/18/2014 recorded AHI 30.5 per hour with body weight 217 pounds   Deneise Lever Diplomate, American Board of Sleep Medicine  ELECTRONICALLY SIGNED ON:  10/01/2014, 8:14 PM [place] PH: [phone]   FX: [phone] [place]

## 2014-10-20 DIAGNOSIS — M4716 Other spondylosis with myelopathy, lumbar region: Secondary | ICD-10-CM | POA: Diagnosis not present

## 2014-10-20 DIAGNOSIS — M545 Low back pain: Secondary | ICD-10-CM | POA: Diagnosis not present

## 2014-10-20 DIAGNOSIS — M5136 Other intervertebral disc degeneration, lumbar region: Secondary | ICD-10-CM | POA: Diagnosis not present

## 2014-10-26 DIAGNOSIS — L814 Other melanin hyperpigmentation: Secondary | ICD-10-CM | POA: Diagnosis not present

## 2014-10-26 DIAGNOSIS — D225 Melanocytic nevi of trunk: Secondary | ICD-10-CM | POA: Diagnosis not present

## 2014-10-26 DIAGNOSIS — D1801 Hemangioma of skin and subcutaneous tissue: Secondary | ICD-10-CM | POA: Diagnosis not present

## 2014-10-26 DIAGNOSIS — L570 Actinic keratosis: Secondary | ICD-10-CM | POA: Diagnosis not present

## 2014-10-26 DIAGNOSIS — Z85820 Personal history of malignant melanoma of skin: Secondary | ICD-10-CM | POA: Diagnosis not present

## 2014-10-26 DIAGNOSIS — L821 Other seborrheic keratosis: Secondary | ICD-10-CM | POA: Diagnosis not present

## 2014-10-26 DIAGNOSIS — D2262 Melanocytic nevi of left upper limb, including shoulder: Secondary | ICD-10-CM | POA: Diagnosis not present

## 2014-10-26 DIAGNOSIS — D2271 Melanocytic nevi of right lower limb, including hip: Secondary | ICD-10-CM | POA: Diagnosis not present

## 2014-10-26 DIAGNOSIS — D2261 Melanocytic nevi of right upper limb, including shoulder: Secondary | ICD-10-CM | POA: Diagnosis not present

## 2014-11-24 DIAGNOSIS — M545 Low back pain: Secondary | ICD-10-CM | POA: Diagnosis not present

## 2014-11-24 DIAGNOSIS — M5416 Radiculopathy, lumbar region: Secondary | ICD-10-CM | POA: Diagnosis not present

## 2014-11-24 DIAGNOSIS — M4716 Other spondylosis with myelopathy, lumbar region: Secondary | ICD-10-CM | POA: Diagnosis not present

## 2014-11-24 DIAGNOSIS — M5127 Other intervertebral disc displacement, lumbosacral region: Secondary | ICD-10-CM | POA: Diagnosis not present

## 2014-12-11 DIAGNOSIS — M47816 Spondylosis without myelopathy or radiculopathy, lumbar region: Secondary | ICD-10-CM | POA: Diagnosis not present

## 2014-12-18 DIAGNOSIS — M109 Gout, unspecified: Secondary | ICD-10-CM | POA: Diagnosis not present

## 2014-12-18 DIAGNOSIS — E780 Pure hypercholesterolemia: Secondary | ICD-10-CM | POA: Diagnosis not present

## 2014-12-18 DIAGNOSIS — E1165 Type 2 diabetes mellitus with hyperglycemia: Secondary | ICD-10-CM | POA: Diagnosis not present

## 2014-12-18 DIAGNOSIS — E538 Deficiency of other specified B group vitamins: Secondary | ICD-10-CM | POA: Diagnosis not present

## 2014-12-18 DIAGNOSIS — D649 Anemia, unspecified: Secondary | ICD-10-CM | POA: Diagnosis not present

## 2014-12-20 DIAGNOSIS — E538 Deficiency of other specified B group vitamins: Secondary | ICD-10-CM | POA: Diagnosis not present

## 2014-12-20 DIAGNOSIS — Z9181 History of falling: Secondary | ICD-10-CM | POA: Diagnosis not present

## 2014-12-20 DIAGNOSIS — Z1389 Encounter for screening for other disorder: Secondary | ICD-10-CM | POA: Diagnosis not present

## 2014-12-20 DIAGNOSIS — E780 Pure hypercholesterolemia: Secondary | ICD-10-CM | POA: Diagnosis not present

## 2014-12-20 DIAGNOSIS — E119 Type 2 diabetes mellitus without complications: Secondary | ICD-10-CM | POA: Diagnosis not present

## 2014-12-20 DIAGNOSIS — I10 Essential (primary) hypertension: Secondary | ICD-10-CM | POA: Diagnosis not present

## 2014-12-20 DIAGNOSIS — M109 Gout, unspecified: Secondary | ICD-10-CM | POA: Diagnosis not present

## 2014-12-20 DIAGNOSIS — N182 Chronic kidney disease, stage 2 (mild): Secondary | ICD-10-CM | POA: Diagnosis not present

## 2015-01-23 DIAGNOSIS — H40053 Ocular hypertension, bilateral: Secondary | ICD-10-CM | POA: Diagnosis not present

## 2015-01-23 DIAGNOSIS — H2513 Age-related nuclear cataract, bilateral: Secondary | ICD-10-CM | POA: Diagnosis not present

## 2015-01-23 DIAGNOSIS — H25013 Cortical age-related cataract, bilateral: Secondary | ICD-10-CM | POA: Diagnosis not present

## 2015-01-31 DIAGNOSIS — L570 Actinic keratosis: Secondary | ICD-10-CM | POA: Diagnosis not present

## 2015-01-31 DIAGNOSIS — L821 Other seborrheic keratosis: Secondary | ICD-10-CM | POA: Diagnosis not present

## 2015-01-31 DIAGNOSIS — D1801 Hemangioma of skin and subcutaneous tissue: Secondary | ICD-10-CM | POA: Diagnosis not present

## 2015-01-31 DIAGNOSIS — Z85820 Personal history of malignant melanoma of skin: Secondary | ICD-10-CM | POA: Diagnosis not present

## 2015-04-18 DIAGNOSIS — E119 Type 2 diabetes mellitus without complications: Secondary | ICD-10-CM | POA: Diagnosis not present

## 2015-04-18 DIAGNOSIS — Z125 Encounter for screening for malignant neoplasm of prostate: Secondary | ICD-10-CM | POA: Diagnosis not present

## 2015-04-18 DIAGNOSIS — D649 Anemia, unspecified: Secondary | ICD-10-CM | POA: Diagnosis not present

## 2015-04-18 DIAGNOSIS — E7800 Pure hypercholesterolemia, unspecified: Secondary | ICD-10-CM | POA: Diagnosis not present

## 2015-04-18 DIAGNOSIS — M109 Gout, unspecified: Secondary | ICD-10-CM | POA: Diagnosis not present

## 2015-04-18 DIAGNOSIS — E538 Deficiency of other specified B group vitamins: Secondary | ICD-10-CM | POA: Diagnosis not present

## 2015-04-23 DIAGNOSIS — M109 Gout, unspecified: Secondary | ICD-10-CM | POA: Diagnosis not present

## 2015-04-23 DIAGNOSIS — Z6830 Body mass index (BMI) 30.0-30.9, adult: Secondary | ICD-10-CM | POA: Diagnosis not present

## 2015-04-23 DIAGNOSIS — N182 Chronic kidney disease, stage 2 (mild): Secondary | ICD-10-CM | POA: Diagnosis not present

## 2015-04-23 DIAGNOSIS — E119 Type 2 diabetes mellitus without complications: Secondary | ICD-10-CM | POA: Diagnosis not present

## 2015-04-23 DIAGNOSIS — E538 Deficiency of other specified B group vitamins: Secondary | ICD-10-CM | POA: Diagnosis not present

## 2015-04-23 DIAGNOSIS — E7800 Pure hypercholesterolemia, unspecified: Secondary | ICD-10-CM | POA: Diagnosis not present

## 2015-04-23 DIAGNOSIS — I10 Essential (primary) hypertension: Secondary | ICD-10-CM | POA: Diagnosis not present

## 2015-04-23 DIAGNOSIS — Z23 Encounter for immunization: Secondary | ICD-10-CM | POA: Diagnosis not present

## 2015-07-23 DIAGNOSIS — H40053 Ocular hypertension, bilateral: Secondary | ICD-10-CM | POA: Diagnosis not present

## 2015-07-23 DIAGNOSIS — E119 Type 2 diabetes mellitus without complications: Secondary | ICD-10-CM | POA: Diagnosis not present

## 2015-07-23 DIAGNOSIS — H25013 Cortical age-related cataract, bilateral: Secondary | ICD-10-CM | POA: Diagnosis not present

## 2015-07-23 DIAGNOSIS — H2513 Age-related nuclear cataract, bilateral: Secondary | ICD-10-CM | POA: Diagnosis not present

## 2015-08-15 DIAGNOSIS — H2511 Age-related nuclear cataract, right eye: Secondary | ICD-10-CM | POA: Diagnosis not present

## 2015-08-15 DIAGNOSIS — H25011 Cortical age-related cataract, right eye: Secondary | ICD-10-CM | POA: Diagnosis not present

## 2015-08-15 DIAGNOSIS — H25013 Cortical age-related cataract, bilateral: Secondary | ICD-10-CM | POA: Diagnosis not present

## 2015-08-15 DIAGNOSIS — H2513 Age-related nuclear cataract, bilateral: Secondary | ICD-10-CM | POA: Diagnosis not present

## 2015-08-23 DIAGNOSIS — E538 Deficiency of other specified B group vitamins: Secondary | ICD-10-CM | POA: Diagnosis not present

## 2015-08-23 DIAGNOSIS — E7800 Pure hypercholesterolemia, unspecified: Secondary | ICD-10-CM | POA: Diagnosis not present

## 2015-08-23 DIAGNOSIS — M109 Gout, unspecified: Secondary | ICD-10-CM | POA: Diagnosis not present

## 2015-08-23 DIAGNOSIS — E1165 Type 2 diabetes mellitus with hyperglycemia: Secondary | ICD-10-CM | POA: Diagnosis not present

## 2015-08-28 DIAGNOSIS — M109 Gout, unspecified: Secondary | ICD-10-CM | POA: Diagnosis not present

## 2015-08-28 DIAGNOSIS — I10 Essential (primary) hypertension: Secondary | ICD-10-CM | POA: Diagnosis not present

## 2015-08-28 DIAGNOSIS — Z139 Encounter for screening, unspecified: Secondary | ICD-10-CM | POA: Diagnosis not present

## 2015-08-28 DIAGNOSIS — Z6829 Body mass index (BMI) 29.0-29.9, adult: Secondary | ICD-10-CM | POA: Diagnosis not present

## 2015-08-28 DIAGNOSIS — N182 Chronic kidney disease, stage 2 (mild): Secondary | ICD-10-CM | POA: Diagnosis not present

## 2015-08-28 DIAGNOSIS — E7800 Pure hypercholesterolemia, unspecified: Secondary | ICD-10-CM | POA: Diagnosis not present

## 2015-08-28 DIAGNOSIS — E1165 Type 2 diabetes mellitus with hyperglycemia: Secondary | ICD-10-CM | POA: Diagnosis not present

## 2015-08-30 DIAGNOSIS — H2512 Age-related nuclear cataract, left eye: Secondary | ICD-10-CM | POA: Diagnosis not present

## 2015-08-30 DIAGNOSIS — H25012 Cortical age-related cataract, left eye: Secondary | ICD-10-CM | POA: Diagnosis not present

## 2015-09-05 DIAGNOSIS — H25012 Cortical age-related cataract, left eye: Secondary | ICD-10-CM | POA: Diagnosis not present

## 2015-09-05 DIAGNOSIS — H2512 Age-related nuclear cataract, left eye: Secondary | ICD-10-CM | POA: Diagnosis not present

## 2015-09-12 DIAGNOSIS — I2510 Atherosclerotic heart disease of native coronary artery without angina pectoris: Secondary | ICD-10-CM | POA: Diagnosis not present

## 2015-09-12 DIAGNOSIS — I10 Essential (primary) hypertension: Secondary | ICD-10-CM | POA: Diagnosis not present

## 2015-09-12 DIAGNOSIS — E119 Type 2 diabetes mellitus without complications: Secondary | ICD-10-CM | POA: Diagnosis not present

## 2015-10-17 DIAGNOSIS — E119 Type 2 diabetes mellitus without complications: Secondary | ICD-10-CM | POA: Diagnosis not present

## 2015-10-17 DIAGNOSIS — I2510 Atherosclerotic heart disease of native coronary artery without angina pectoris: Secondary | ICD-10-CM | POA: Diagnosis not present

## 2015-10-17 DIAGNOSIS — I10 Essential (primary) hypertension: Secondary | ICD-10-CM | POA: Diagnosis not present

## 2015-10-31 DIAGNOSIS — D692 Other nonthrombocytopenic purpura: Secondary | ICD-10-CM | POA: Diagnosis not present

## 2015-10-31 DIAGNOSIS — L570 Actinic keratosis: Secondary | ICD-10-CM | POA: Diagnosis not present

## 2015-10-31 DIAGNOSIS — L814 Other melanin hyperpigmentation: Secondary | ICD-10-CM | POA: Diagnosis not present

## 2015-10-31 DIAGNOSIS — D2272 Melanocytic nevi of left lower limb, including hip: Secondary | ICD-10-CM | POA: Diagnosis not present

## 2015-10-31 DIAGNOSIS — D1801 Hemangioma of skin and subcutaneous tissue: Secondary | ICD-10-CM | POA: Diagnosis not present

## 2015-10-31 DIAGNOSIS — D2271 Melanocytic nevi of right lower limb, including hip: Secondary | ICD-10-CM | POA: Diagnosis not present

## 2015-10-31 DIAGNOSIS — D2262 Melanocytic nevi of left upper limb, including shoulder: Secondary | ICD-10-CM | POA: Diagnosis not present

## 2015-10-31 DIAGNOSIS — Z85820 Personal history of malignant melanoma of skin: Secondary | ICD-10-CM | POA: Diagnosis not present

## 2015-10-31 DIAGNOSIS — D225 Melanocytic nevi of trunk: Secondary | ICD-10-CM | POA: Diagnosis not present

## 2015-10-31 DIAGNOSIS — L821 Other seborrheic keratosis: Secondary | ICD-10-CM | POA: Diagnosis not present

## 2015-11-21 DIAGNOSIS — E119 Type 2 diabetes mellitus without complications: Secondary | ICD-10-CM | POA: Diagnosis not present

## 2015-11-21 DIAGNOSIS — I10 Essential (primary) hypertension: Secondary | ICD-10-CM | POA: Diagnosis not present

## 2015-11-21 DIAGNOSIS — I2510 Atherosclerotic heart disease of native coronary artery without angina pectoris: Secondary | ICD-10-CM | POA: Diagnosis not present

## 2015-12-05 IMAGING — CT CT ANGIO CHEST
2 of 9 series · 18 of 46 positions shown · IV contrast (Omni 300)
Comparison: Chest radiographs 6861 hr today.

CLINICAL DATA: 73-year-old male with acute left chest pain
beginning at 2122 hrs. Pain radiating to the left shoulder and back.
Recent melanoma resection from the shoulder. Initial encounter.

EXAM:
CT ANGIOGRAPHY CHEST WITH CONTRAST
TECHNIQUE: Multidetector CT imaging of the chest was performed using the
standard protocol during bolus administration of intravenous
contrast. Multiplanar CT image reconstructions and MIPs were
obtained to evaluate the vascular anatomy.
CONTRAST:  59mL OMNIPAQUE IOHEXOL 350 MG/ML SOLN

[Series 5: thins · axial · 0.69mm/px · z∈[-279,-27]mm · 15 of 284 slices shown]
[im 16/284  lung]
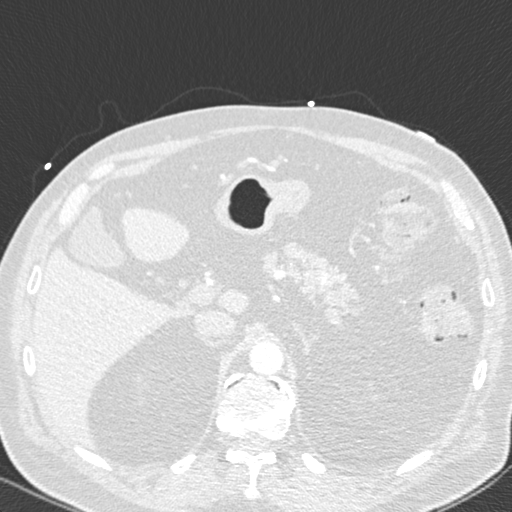
[im 32/284  soft-tissue]
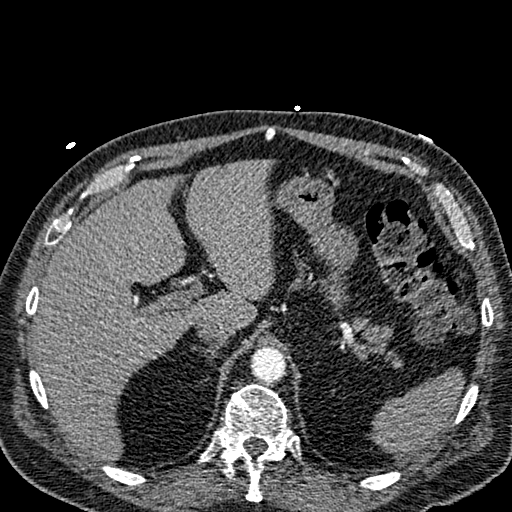
[im 48/284  lung]
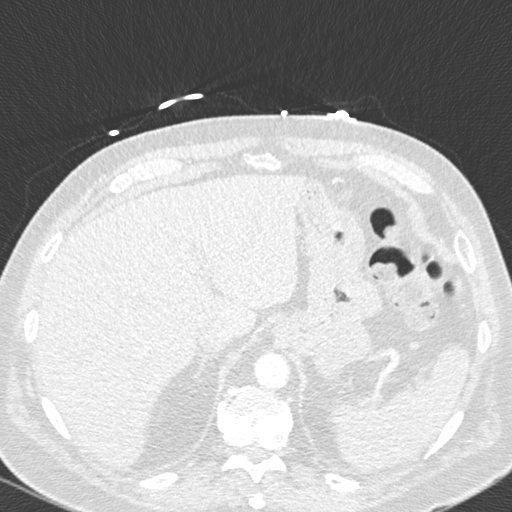
[im 63/284  soft-tissue]
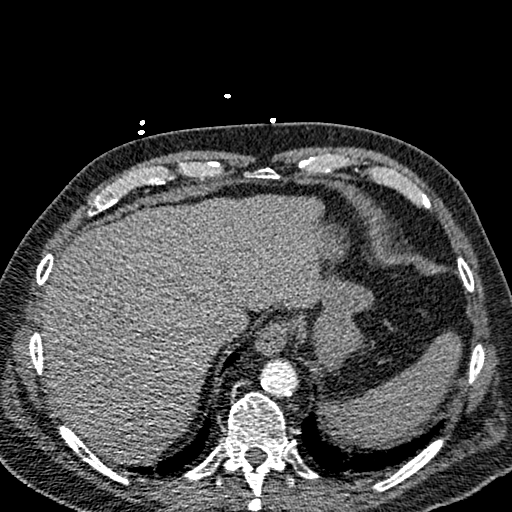
[im 95/284  lung]
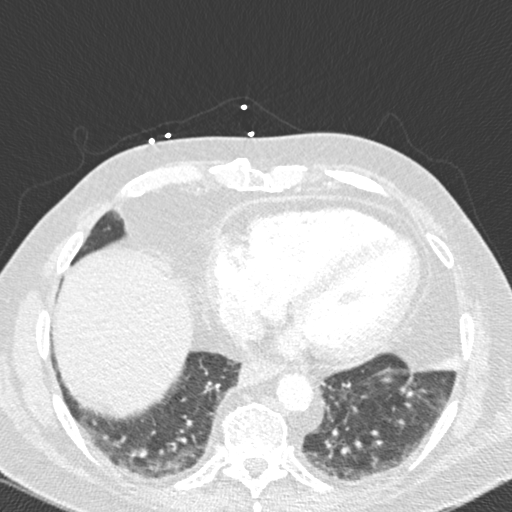
[im 111/284  soft-tissue]
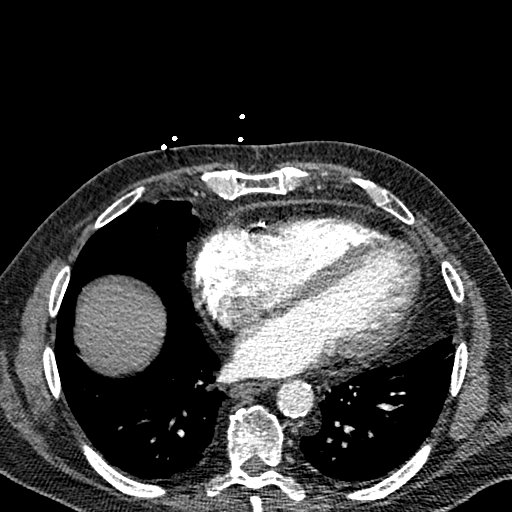
[im 126/284  lung]
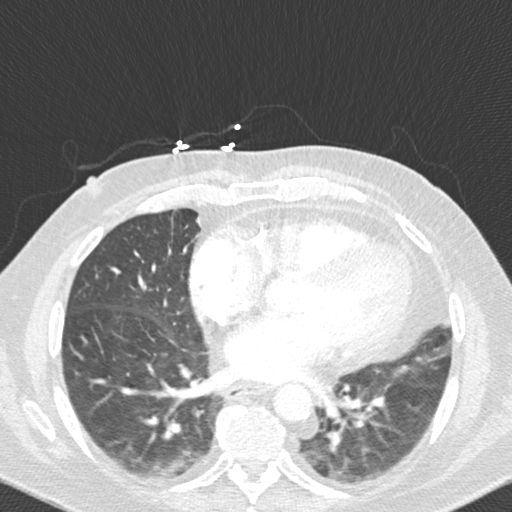
[im 142/284  soft-tissue]
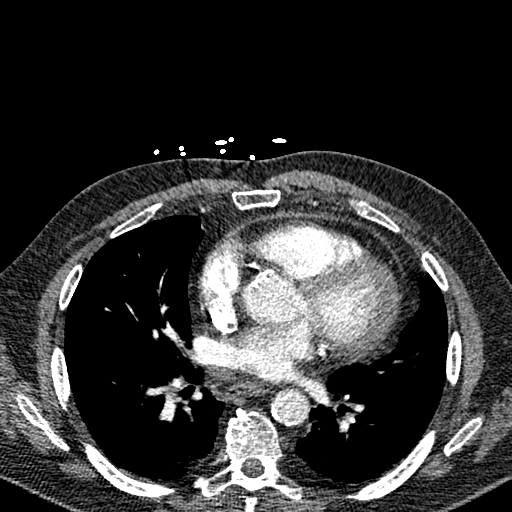
[im 158/284  lung]
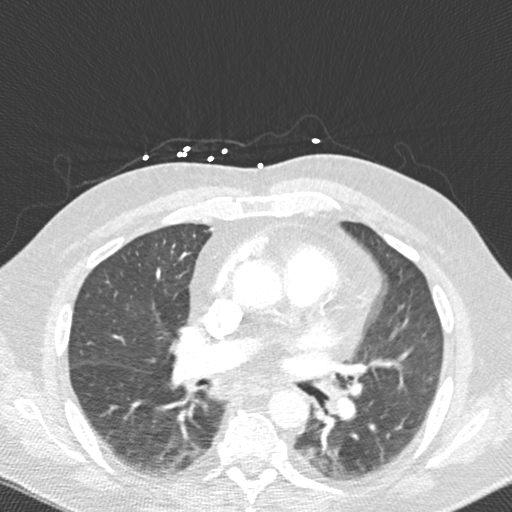
[im 173/284  soft-tissue]
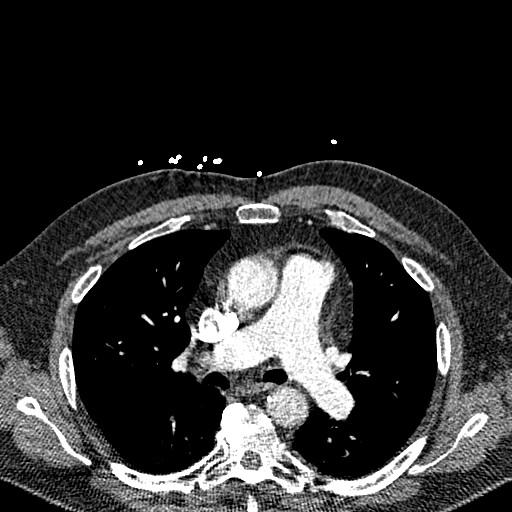
[im 189/284  lung]
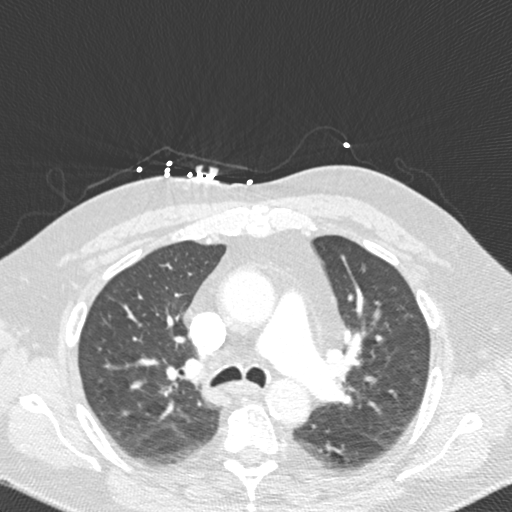
[im 221/284  soft-tissue]
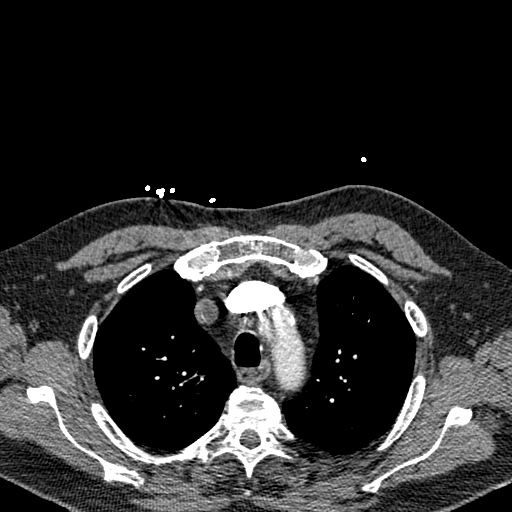
[im 236/284  lung]
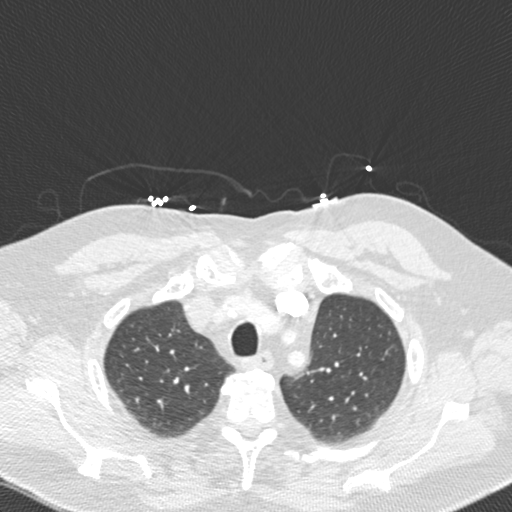
[im 252/284  soft-tissue]
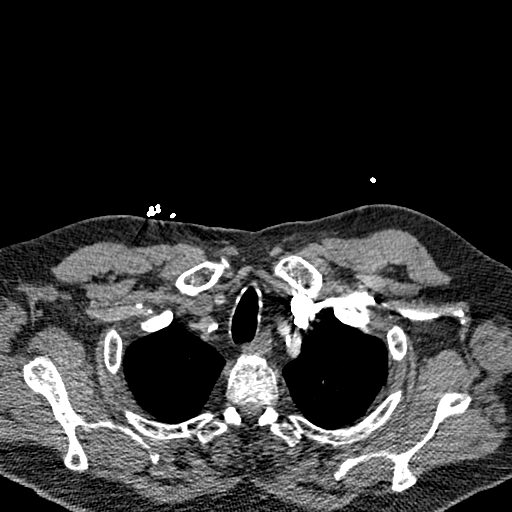
[im 268/284  lung]
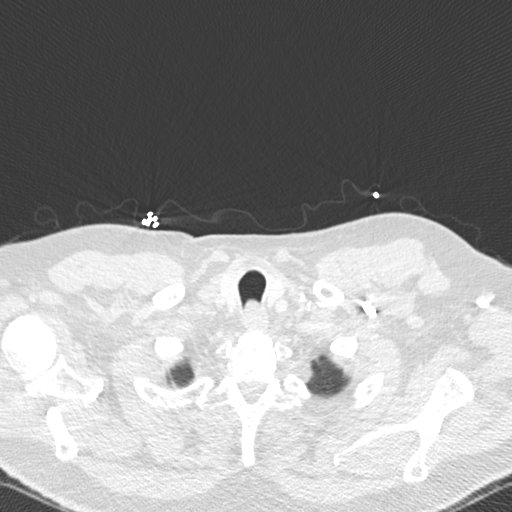

[Series 7: coronal mpr · coronal · 0.62mm/px · 3 of 122 slices shown]
[im 31/122  soft-tissue]
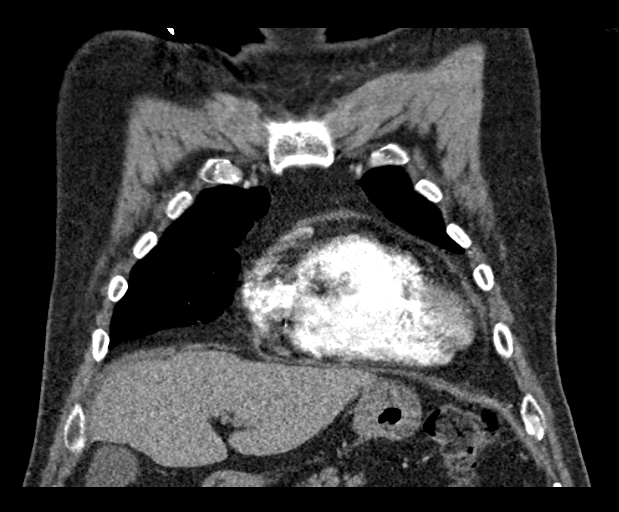
[im 61/122  soft-tissue]
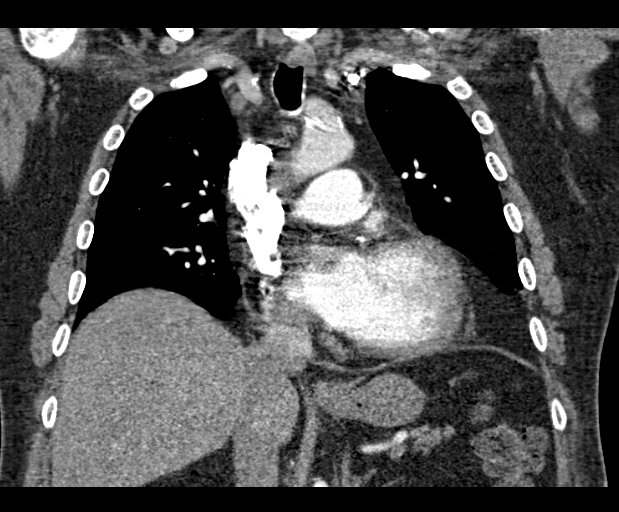
[im 91/122  soft-tissue]
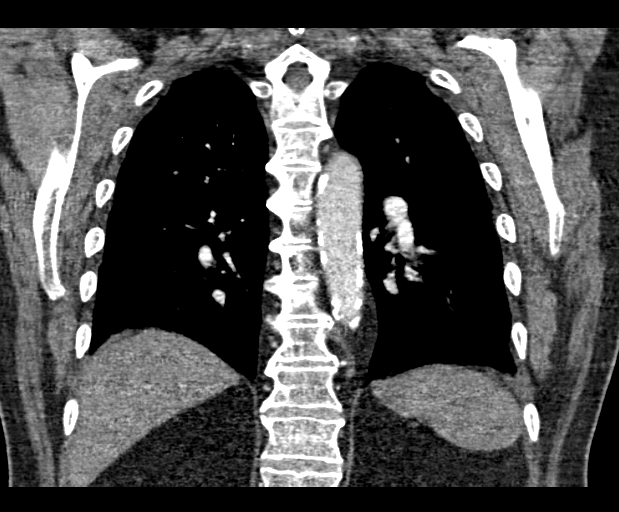

[18 of 46 positions shown; findings below may reference images not displayed]

FINDINGS: Adequate contrast bolus timing in the pulmonary arterial tree.
Respiratory motion, especially at the lung bases. No focal filling
defect identified in the pulmonary arterial tree to suggest the
presence of acute pulmonary embolism.

Major airways are patent, except for some atelectatic changes. Mild
linear in dependent opacity in both lung bases most resembles
atelectasis.

Mediastinal lipomatosis. Mild to moderate cardiomegaly. No
pericardial or pleural effusion. Widespread calcified
atherosclerosis of the aorta and its branches, including coronary
artery calcified plaque. No thoracic aortic dissection or aneurysm.
No mediastinal or hilar lymphadenopathy. No axillary
lymphadenopathy. Negative thoracic inlet.

Negative visualized liver, gallbladder, spleen, pancreas, adrenal
glands, and bowel in the upper abdomen.

Osteopenia. No acute osseous abnormality identified.

Review of the MIP images confirms the above findings.
IMPRESSION: 1. No evidence of acute pulmonary embolus.
2. No thoracic aortic dissection or aneurysm. Calcified plaque,
including calcified coronary artery plaque.
3. Pulmonary atelectasis.  Mild to moderate cardiomegaly.

## 2015-12-28 DIAGNOSIS — M109 Gout, unspecified: Secondary | ICD-10-CM | POA: Diagnosis not present

## 2015-12-28 DIAGNOSIS — E7800 Pure hypercholesterolemia, unspecified: Secondary | ICD-10-CM | POA: Diagnosis not present

## 2015-12-28 DIAGNOSIS — E1165 Type 2 diabetes mellitus with hyperglycemia: Secondary | ICD-10-CM | POA: Diagnosis not present

## 2016-01-01 DIAGNOSIS — Z1389 Encounter for screening for other disorder: Secondary | ICD-10-CM | POA: Diagnosis not present

## 2016-01-01 DIAGNOSIS — Z9181 History of falling: Secondary | ICD-10-CM | POA: Diagnosis not present

## 2016-01-01 DIAGNOSIS — Z6829 Body mass index (BMI) 29.0-29.9, adult: Secondary | ICD-10-CM | POA: Diagnosis not present

## 2016-01-01 DIAGNOSIS — I10 Essential (primary) hypertension: Secondary | ICD-10-CM | POA: Diagnosis not present

## 2016-01-01 DIAGNOSIS — E7800 Pure hypercholesterolemia, unspecified: Secondary | ICD-10-CM | POA: Diagnosis not present

## 2016-01-01 DIAGNOSIS — E1165 Type 2 diabetes mellitus with hyperglycemia: Secondary | ICD-10-CM | POA: Diagnosis not present

## 2016-01-01 DIAGNOSIS — N182 Chronic kidney disease, stage 2 (mild): Secondary | ICD-10-CM | POA: Diagnosis not present

## 2016-01-01 DIAGNOSIS — M109 Gout, unspecified: Secondary | ICD-10-CM | POA: Diagnosis not present

## 2016-02-11 DIAGNOSIS — H40053 Ocular hypertension, bilateral: Secondary | ICD-10-CM | POA: Diagnosis not present

## 2016-02-29 DIAGNOSIS — E1165 Type 2 diabetes mellitus with hyperglycemia: Secondary | ICD-10-CM | POA: Diagnosis not present

## 2016-02-29 DIAGNOSIS — Z6829 Body mass index (BMI) 29.0-29.9, adult: Secondary | ICD-10-CM | POA: Diagnosis not present

## 2016-02-29 DIAGNOSIS — I872 Venous insufficiency (chronic) (peripheral): Secondary | ICD-10-CM | POA: Diagnosis not present

## 2016-04-21 DIAGNOSIS — N182 Chronic kidney disease, stage 2 (mild): Secondary | ICD-10-CM | POA: Diagnosis not present

## 2016-04-21 DIAGNOSIS — M109 Gout, unspecified: Secondary | ICD-10-CM | POA: Diagnosis not present

## 2016-04-21 DIAGNOSIS — E1165 Type 2 diabetes mellitus with hyperglycemia: Secondary | ICD-10-CM | POA: Diagnosis not present

## 2016-04-21 DIAGNOSIS — E7800 Pure hypercholesterolemia, unspecified: Secondary | ICD-10-CM | POA: Diagnosis not present

## 2016-04-21 DIAGNOSIS — E538 Deficiency of other specified B group vitamins: Secondary | ICD-10-CM | POA: Diagnosis not present

## 2016-04-23 DIAGNOSIS — N400 Enlarged prostate without lower urinary tract symptoms: Secondary | ICD-10-CM | POA: Diagnosis not present

## 2016-04-23 DIAGNOSIS — E1165 Type 2 diabetes mellitus with hyperglycemia: Secondary | ICD-10-CM | POA: Diagnosis not present

## 2016-04-23 DIAGNOSIS — E669 Obesity, unspecified: Secondary | ICD-10-CM | POA: Diagnosis not present

## 2016-04-23 DIAGNOSIS — Z125 Encounter for screening for malignant neoplasm of prostate: Secondary | ICD-10-CM | POA: Diagnosis not present

## 2016-04-23 DIAGNOSIS — E7800 Pure hypercholesterolemia, unspecified: Secondary | ICD-10-CM | POA: Diagnosis not present

## 2016-04-23 DIAGNOSIS — M109 Gout, unspecified: Secondary | ICD-10-CM | POA: Diagnosis not present

## 2016-04-23 DIAGNOSIS — I10 Essential (primary) hypertension: Secondary | ICD-10-CM | POA: Diagnosis not present

## 2016-06-16 DIAGNOSIS — H40053 Ocular hypertension, bilateral: Secondary | ICD-10-CM | POA: Diagnosis not present

## 2016-08-22 DIAGNOSIS — E1165 Type 2 diabetes mellitus with hyperglycemia: Secondary | ICD-10-CM | POA: Diagnosis not present

## 2016-08-22 DIAGNOSIS — E7800 Pure hypercholesterolemia, unspecified: Secondary | ICD-10-CM | POA: Diagnosis not present

## 2016-08-22 DIAGNOSIS — N182 Chronic kidney disease, stage 2 (mild): Secondary | ICD-10-CM | POA: Diagnosis not present

## 2016-08-27 DIAGNOSIS — I10 Essential (primary) hypertension: Secondary | ICD-10-CM | POA: Diagnosis not present

## 2016-08-27 DIAGNOSIS — E119 Type 2 diabetes mellitus without complications: Secondary | ICD-10-CM | POA: Diagnosis not present

## 2016-08-27 DIAGNOSIS — Z6830 Body mass index (BMI) 30.0-30.9, adult: Secondary | ICD-10-CM | POA: Diagnosis not present

## 2016-08-27 DIAGNOSIS — I2510 Atherosclerotic heart disease of native coronary artery without angina pectoris: Secondary | ICD-10-CM | POA: Diagnosis not present

## 2016-08-27 DIAGNOSIS — E7800 Pure hypercholesterolemia, unspecified: Secondary | ICD-10-CM | POA: Diagnosis not present

## 2016-10-15 DIAGNOSIS — H524 Presbyopia: Secondary | ICD-10-CM | POA: Diagnosis not present

## 2016-10-15 DIAGNOSIS — H40053 Ocular hypertension, bilateral: Secondary | ICD-10-CM | POA: Diagnosis not present

## 2016-10-15 DIAGNOSIS — E119 Type 2 diabetes mellitus without complications: Secondary | ICD-10-CM | POA: Diagnosis not present

## 2016-10-15 DIAGNOSIS — H11153 Pinguecula, bilateral: Secondary | ICD-10-CM | POA: Diagnosis not present

## 2016-10-15 DIAGNOSIS — H01002 Unspecified blepharitis right lower eyelid: Secondary | ICD-10-CM | POA: Diagnosis not present

## 2016-10-15 DIAGNOSIS — Z794 Long term (current) use of insulin: Secondary | ICD-10-CM | POA: Diagnosis not present

## 2016-10-15 DIAGNOSIS — H01004 Unspecified blepharitis left upper eyelid: Secondary | ICD-10-CM | POA: Diagnosis not present

## 2016-10-15 DIAGNOSIS — H52203 Unspecified astigmatism, bilateral: Secondary | ICD-10-CM | POA: Diagnosis not present

## 2016-10-15 DIAGNOSIS — H01001 Unspecified blepharitis right upper eyelid: Secondary | ICD-10-CM | POA: Diagnosis not present

## 2016-10-15 DIAGNOSIS — H01005 Unspecified blepharitis left lower eyelid: Secondary | ICD-10-CM | POA: Diagnosis not present

## 2016-10-15 DIAGNOSIS — Z961 Presence of intraocular lens: Secondary | ICD-10-CM | POA: Diagnosis not present

## 2016-12-03 DIAGNOSIS — I2510 Atherosclerotic heart disease of native coronary artery without angina pectoris: Secondary | ICD-10-CM | POA: Diagnosis not present

## 2016-12-03 DIAGNOSIS — I10 Essential (primary) hypertension: Secondary | ICD-10-CM | POA: Diagnosis not present

## 2016-12-03 DIAGNOSIS — I319 Disease of pericardium, unspecified: Secondary | ICD-10-CM | POA: Diagnosis not present

## 2016-12-09 DIAGNOSIS — Z85820 Personal history of malignant melanoma of skin: Secondary | ICD-10-CM | POA: Diagnosis not present

## 2016-12-09 DIAGNOSIS — C4441 Basal cell carcinoma of skin of scalp and neck: Secondary | ICD-10-CM | POA: Diagnosis not present

## 2016-12-09 DIAGNOSIS — D1801 Hemangioma of skin and subcutaneous tissue: Secondary | ICD-10-CM | POA: Diagnosis not present

## 2016-12-09 DIAGNOSIS — L438 Other lichen planus: Secondary | ICD-10-CM | POA: Diagnosis not present

## 2016-12-09 DIAGNOSIS — D692 Other nonthrombocytopenic purpura: Secondary | ICD-10-CM | POA: Diagnosis not present

## 2016-12-09 DIAGNOSIS — L814 Other melanin hyperpigmentation: Secondary | ICD-10-CM | POA: Diagnosis not present

## 2016-12-09 DIAGNOSIS — L821 Other seborrheic keratosis: Secondary | ICD-10-CM | POA: Diagnosis not present

## 2016-12-09 DIAGNOSIS — L570 Actinic keratosis: Secondary | ICD-10-CM | POA: Diagnosis not present

## 2016-12-30 DIAGNOSIS — M109 Gout, unspecified: Secondary | ICD-10-CM | POA: Diagnosis not present

## 2016-12-30 DIAGNOSIS — E538 Deficiency of other specified B group vitamins: Secondary | ICD-10-CM | POA: Diagnosis not present

## 2016-12-30 DIAGNOSIS — D649 Anemia, unspecified: Secondary | ICD-10-CM | POA: Diagnosis not present

## 2016-12-30 DIAGNOSIS — E1165 Type 2 diabetes mellitus with hyperglycemia: Secondary | ICD-10-CM | POA: Diagnosis not present

## 2016-12-30 DIAGNOSIS — E7800 Pure hypercholesterolemia, unspecified: Secondary | ICD-10-CM | POA: Diagnosis not present

## 2017-01-01 DIAGNOSIS — I2510 Atherosclerotic heart disease of native coronary artery without angina pectoris: Secondary | ICD-10-CM | POA: Diagnosis not present

## 2017-01-01 DIAGNOSIS — G4733 Obstructive sleep apnea (adult) (pediatric): Secondary | ICD-10-CM | POA: Diagnosis not present

## 2017-01-01 DIAGNOSIS — N182 Chronic kidney disease, stage 2 (mild): Secondary | ICD-10-CM | POA: Diagnosis not present

## 2017-01-01 DIAGNOSIS — Z139 Encounter for screening, unspecified: Secondary | ICD-10-CM | POA: Diagnosis not present

## 2017-01-01 DIAGNOSIS — I10 Essential (primary) hypertension: Secondary | ICD-10-CM | POA: Diagnosis not present

## 2017-01-01 DIAGNOSIS — E7800 Pure hypercholesterolemia, unspecified: Secondary | ICD-10-CM | POA: Diagnosis not present

## 2017-01-01 DIAGNOSIS — E119 Type 2 diabetes mellitus without complications: Secondary | ICD-10-CM | POA: Diagnosis not present

## 2017-02-09 DIAGNOSIS — H40053 Ocular hypertension, bilateral: Secondary | ICD-10-CM | POA: Diagnosis not present

## 2017-03-19 DIAGNOSIS — M10072 Idiopathic gout, left ankle and foot: Secondary | ICD-10-CM | POA: Diagnosis not present

## 2017-03-19 DIAGNOSIS — M19079 Primary osteoarthritis, unspecified ankle and foot: Secondary | ICD-10-CM | POA: Diagnosis not present

## 2017-03-19 DIAGNOSIS — E119 Type 2 diabetes mellitus without complications: Secondary | ICD-10-CM | POA: Diagnosis not present

## 2017-03-19 DIAGNOSIS — M79672 Pain in left foot: Secondary | ICD-10-CM | POA: Diagnosis not present

## 2017-05-01 DIAGNOSIS — Z136 Encounter for screening for cardiovascular disorders: Secondary | ICD-10-CM | POA: Diagnosis not present

## 2017-05-01 DIAGNOSIS — Z0000 Encounter for general adult medical examination without abnormal findings: Secondary | ICD-10-CM | POA: Diagnosis not present

## 2017-05-01 DIAGNOSIS — E785 Hyperlipidemia, unspecified: Secondary | ICD-10-CM | POA: Diagnosis not present

## 2017-05-01 DIAGNOSIS — Z139 Encounter for screening, unspecified: Secondary | ICD-10-CM | POA: Diagnosis not present

## 2017-05-01 DIAGNOSIS — Z6831 Body mass index (BMI) 31.0-31.9, adult: Secondary | ICD-10-CM | POA: Diagnosis not present

## 2017-05-01 DIAGNOSIS — Z23 Encounter for immunization: Secondary | ICD-10-CM | POA: Diagnosis not present

## 2017-05-01 DIAGNOSIS — L989 Disorder of the skin and subcutaneous tissue, unspecified: Secondary | ICD-10-CM | POA: Diagnosis not present

## 2017-05-01 DIAGNOSIS — M79672 Pain in left foot: Secondary | ICD-10-CM | POA: Diagnosis not present

## 2017-05-01 DIAGNOSIS — E669 Obesity, unspecified: Secondary | ICD-10-CM | POA: Diagnosis not present

## 2017-05-01 DIAGNOSIS — Z125 Encounter for screening for malignant neoplasm of prostate: Secondary | ICD-10-CM | POA: Diagnosis not present

## 2017-05-13 DIAGNOSIS — E119 Type 2 diabetes mellitus without complications: Secondary | ICD-10-CM | POA: Diagnosis not present

## 2017-05-13 DIAGNOSIS — E7800 Pure hypercholesterolemia, unspecified: Secondary | ICD-10-CM | POA: Diagnosis not present

## 2017-05-13 DIAGNOSIS — M109 Gout, unspecified: Secondary | ICD-10-CM | POA: Diagnosis not present

## 2017-05-13 DIAGNOSIS — N182 Chronic kidney disease, stage 2 (mild): Secondary | ICD-10-CM | POA: Diagnosis not present

## 2017-05-13 DIAGNOSIS — Z125 Encounter for screening for malignant neoplasm of prostate: Secondary | ICD-10-CM | POA: Diagnosis not present

## 2017-05-15 DIAGNOSIS — Z1331 Encounter for screening for depression: Secondary | ICD-10-CM | POA: Diagnosis not present

## 2017-05-15 DIAGNOSIS — Z6831 Body mass index (BMI) 31.0-31.9, adult: Secondary | ICD-10-CM | POA: Diagnosis not present

## 2017-05-15 DIAGNOSIS — E119 Type 2 diabetes mellitus without complications: Secondary | ICD-10-CM | POA: Diagnosis not present

## 2017-05-15 DIAGNOSIS — I2510 Atherosclerotic heart disease of native coronary artery without angina pectoris: Secondary | ICD-10-CM | POA: Diagnosis not present

## 2017-05-15 DIAGNOSIS — I10 Essential (primary) hypertension: Secondary | ICD-10-CM | POA: Diagnosis not present

## 2017-05-15 DIAGNOSIS — Z9181 History of falling: Secondary | ICD-10-CM | POA: Diagnosis not present

## 2017-05-15 DIAGNOSIS — E7800 Pure hypercholesterolemia, unspecified: Secondary | ICD-10-CM | POA: Diagnosis not present

## 2017-05-20 DIAGNOSIS — Z85828 Personal history of other malignant neoplasm of skin: Secondary | ICD-10-CM | POA: Diagnosis not present

## 2017-05-20 DIAGNOSIS — L570 Actinic keratosis: Secondary | ICD-10-CM | POA: Diagnosis not present

## 2017-05-20 DIAGNOSIS — C44722 Squamous cell carcinoma of skin of right lower limb, including hip: Secondary | ICD-10-CM | POA: Diagnosis not present

## 2017-05-20 DIAGNOSIS — Z85820 Personal history of malignant melanoma of skin: Secondary | ICD-10-CM | POA: Diagnosis not present

## 2017-06-15 DIAGNOSIS — H40053 Ocular hypertension, bilateral: Secondary | ICD-10-CM | POA: Diagnosis not present

## 2017-08-21 DIAGNOSIS — Z85828 Personal history of other malignant neoplasm of skin: Secondary | ICD-10-CM | POA: Diagnosis not present

## 2017-08-21 DIAGNOSIS — L570 Actinic keratosis: Secondary | ICD-10-CM | POA: Diagnosis not present

## 2017-08-21 DIAGNOSIS — Z85820 Personal history of malignant melanoma of skin: Secondary | ICD-10-CM | POA: Diagnosis not present

## 2017-09-14 DIAGNOSIS — E119 Type 2 diabetes mellitus without complications: Secondary | ICD-10-CM | POA: Diagnosis not present

## 2017-09-14 DIAGNOSIS — M109 Gout, unspecified: Secondary | ICD-10-CM | POA: Diagnosis not present

## 2017-09-14 DIAGNOSIS — D649 Anemia, unspecified: Secondary | ICD-10-CM | POA: Diagnosis not present

## 2017-09-14 DIAGNOSIS — E7800 Pure hypercholesterolemia, unspecified: Secondary | ICD-10-CM | POA: Diagnosis not present

## 2017-09-16 DIAGNOSIS — E7800 Pure hypercholesterolemia, unspecified: Secondary | ICD-10-CM | POA: Diagnosis not present

## 2017-09-16 DIAGNOSIS — I10 Essential (primary) hypertension: Secondary | ICD-10-CM | POA: Diagnosis not present

## 2017-09-16 DIAGNOSIS — M109 Gout, unspecified: Secondary | ICD-10-CM | POA: Diagnosis not present

## 2017-09-16 DIAGNOSIS — N400 Enlarged prostate without lower urinary tract symptoms: Secondary | ICD-10-CM | POA: Diagnosis not present

## 2017-09-16 DIAGNOSIS — Z6830 Body mass index (BMI) 30.0-30.9, adult: Secondary | ICD-10-CM | POA: Diagnosis not present

## 2017-09-16 DIAGNOSIS — E119 Type 2 diabetes mellitus without complications: Secondary | ICD-10-CM | POA: Diagnosis not present

## 2017-09-16 DIAGNOSIS — I2510 Atherosclerotic heart disease of native coronary artery without angina pectoris: Secondary | ICD-10-CM | POA: Diagnosis not present

## 2017-09-24 DIAGNOSIS — H903 Sensorineural hearing loss, bilateral: Secondary | ICD-10-CM | POA: Diagnosis not present

## 2017-09-29 DIAGNOSIS — H903 Sensorineural hearing loss, bilateral: Secondary | ICD-10-CM | POA: Diagnosis not present

## 2017-10-15 DIAGNOSIS — H40053 Ocular hypertension, bilateral: Secondary | ICD-10-CM | POA: Diagnosis not present

## 2017-10-15 DIAGNOSIS — E119 Type 2 diabetes mellitus without complications: Secondary | ICD-10-CM | POA: Diagnosis not present

## 2017-10-15 DIAGNOSIS — Z7984 Long term (current) use of oral hypoglycemic drugs: Secondary | ICD-10-CM | POA: Diagnosis not present

## 2017-10-15 DIAGNOSIS — H0100A Unspecified blepharitis right eye, upper and lower eyelids: Secondary | ICD-10-CM | POA: Diagnosis not present

## 2017-11-20 DIAGNOSIS — B351 Tinea unguium: Secondary | ICD-10-CM | POA: Diagnosis not present

## 2017-11-20 DIAGNOSIS — E11628 Type 2 diabetes mellitus with other skin complications: Secondary | ICD-10-CM | POA: Diagnosis not present

## 2017-12-10 DIAGNOSIS — L570 Actinic keratosis: Secondary | ICD-10-CM | POA: Diagnosis not present

## 2017-12-10 DIAGNOSIS — C44329 Squamous cell carcinoma of skin of other parts of face: Secondary | ICD-10-CM | POA: Diagnosis not present

## 2017-12-10 DIAGNOSIS — D045 Carcinoma in situ of skin of trunk: Secondary | ICD-10-CM | POA: Diagnosis not present

## 2017-12-10 DIAGNOSIS — D485 Neoplasm of uncertain behavior of skin: Secondary | ICD-10-CM | POA: Diagnosis not present

## 2017-12-10 DIAGNOSIS — D1801 Hemangioma of skin and subcutaneous tissue: Secondary | ICD-10-CM | POA: Diagnosis not present

## 2017-12-10 DIAGNOSIS — L821 Other seborrheic keratosis: Secondary | ICD-10-CM | POA: Diagnosis not present

## 2017-12-10 DIAGNOSIS — Z85820 Personal history of malignant melanoma of skin: Secondary | ICD-10-CM | POA: Diagnosis not present

## 2017-12-10 DIAGNOSIS — D225 Melanocytic nevi of trunk: Secondary | ICD-10-CM | POA: Diagnosis not present

## 2017-12-10 DIAGNOSIS — Z85828 Personal history of other malignant neoplasm of skin: Secondary | ICD-10-CM | POA: Diagnosis not present

## 2017-12-15 DIAGNOSIS — Z794 Long term (current) use of insulin: Secondary | ICD-10-CM | POA: Diagnosis not present

## 2017-12-15 DIAGNOSIS — Z87898 Personal history of other specified conditions: Secondary | ICD-10-CM | POA: Diagnosis not present

## 2017-12-15 DIAGNOSIS — I2510 Atherosclerotic heart disease of native coronary artery without angina pectoris: Secondary | ICD-10-CM | POA: Diagnosis not present

## 2017-12-15 DIAGNOSIS — E119 Type 2 diabetes mellitus without complications: Secondary | ICD-10-CM | POA: Diagnosis not present

## 2017-12-21 DIAGNOSIS — N400 Enlarged prostate without lower urinary tract symptoms: Secondary | ICD-10-CM | POA: Diagnosis not present

## 2017-12-21 DIAGNOSIS — Z6830 Body mass index (BMI) 30.0-30.9, adult: Secondary | ICD-10-CM | POA: Diagnosis not present

## 2017-12-21 DIAGNOSIS — E119 Type 2 diabetes mellitus without complications: Secondary | ICD-10-CM | POA: Diagnosis not present

## 2017-12-21 DIAGNOSIS — I10 Essential (primary) hypertension: Secondary | ICD-10-CM | POA: Diagnosis not present

## 2017-12-21 DIAGNOSIS — E11628 Type 2 diabetes mellitus with other skin complications: Secondary | ICD-10-CM | POA: Diagnosis not present

## 2017-12-23 DIAGNOSIS — Z85828 Personal history of other malignant neoplasm of skin: Secondary | ICD-10-CM | POA: Diagnosis not present

## 2017-12-23 DIAGNOSIS — Z85820 Personal history of malignant melanoma of skin: Secondary | ICD-10-CM | POA: Diagnosis not present

## 2017-12-23 DIAGNOSIS — D045 Carcinoma in situ of skin of trunk: Secondary | ICD-10-CM | POA: Diagnosis not present

## 2017-12-28 DIAGNOSIS — Z6830 Body mass index (BMI) 30.0-30.9, adult: Secondary | ICD-10-CM | POA: Diagnosis not present

## 2017-12-28 DIAGNOSIS — N400 Enlarged prostate without lower urinary tract symptoms: Secondary | ICD-10-CM | POA: Diagnosis not present

## 2017-12-28 DIAGNOSIS — B351 Tinea unguium: Secondary | ICD-10-CM | POA: Diagnosis not present

## 2017-12-28 DIAGNOSIS — B353 Tinea pedis: Secondary | ICD-10-CM | POA: Diagnosis not present

## 2017-12-28 DIAGNOSIS — I10 Essential (primary) hypertension: Secondary | ICD-10-CM | POA: Diagnosis not present

## 2018-01-12 DIAGNOSIS — N400 Enlarged prostate without lower urinary tract symptoms: Secondary | ICD-10-CM | POA: Diagnosis not present

## 2018-01-12 DIAGNOSIS — Z6831 Body mass index (BMI) 31.0-31.9, adult: Secondary | ICD-10-CM | POA: Diagnosis not present

## 2018-01-12 DIAGNOSIS — B351 Tinea unguium: Secondary | ICD-10-CM | POA: Diagnosis not present

## 2018-01-14 DIAGNOSIS — Z85828 Personal history of other malignant neoplasm of skin: Secondary | ICD-10-CM | POA: Diagnosis not present

## 2018-01-14 DIAGNOSIS — C44329 Squamous cell carcinoma of skin of other parts of face: Secondary | ICD-10-CM | POA: Diagnosis not present

## 2018-01-14 DIAGNOSIS — Z85820 Personal history of malignant melanoma of skin: Secondary | ICD-10-CM | POA: Diagnosis not present

## 2018-01-18 DIAGNOSIS — D649 Anemia, unspecified: Secondary | ICD-10-CM | POA: Diagnosis not present

## 2018-01-18 DIAGNOSIS — E119 Type 2 diabetes mellitus without complications: Secondary | ICD-10-CM | POA: Diagnosis not present

## 2018-01-18 DIAGNOSIS — E7800 Pure hypercholesterolemia, unspecified: Secondary | ICD-10-CM | POA: Diagnosis not present

## 2018-01-18 DIAGNOSIS — M109 Gout, unspecified: Secondary | ICD-10-CM | POA: Diagnosis not present

## 2018-01-25 DIAGNOSIS — E119 Type 2 diabetes mellitus without complications: Secondary | ICD-10-CM | POA: Diagnosis not present

## 2018-01-25 DIAGNOSIS — I10 Essential (primary) hypertension: Secondary | ICD-10-CM | POA: Diagnosis not present

## 2018-01-25 DIAGNOSIS — Z1339 Encounter for screening examination for other mental health and behavioral disorders: Secondary | ICD-10-CM | POA: Diagnosis not present

## 2018-01-25 DIAGNOSIS — N400 Enlarged prostate without lower urinary tract symptoms: Secondary | ICD-10-CM | POA: Diagnosis not present

## 2018-01-25 DIAGNOSIS — I2510 Atherosclerotic heart disease of native coronary artery without angina pectoris: Secondary | ICD-10-CM | POA: Diagnosis not present

## 2018-01-25 DIAGNOSIS — E7800 Pure hypercholesterolemia, unspecified: Secondary | ICD-10-CM | POA: Diagnosis not present

## 2018-01-25 DIAGNOSIS — M109 Gout, unspecified: Secondary | ICD-10-CM | POA: Diagnosis not present

## 2018-03-02 DIAGNOSIS — Z23 Encounter for immunization: Secondary | ICD-10-CM | POA: Diagnosis not present

## 2018-03-17 DIAGNOSIS — H40053 Ocular hypertension, bilateral: Secondary | ICD-10-CM | POA: Diagnosis not present

## 2018-04-16 DIAGNOSIS — H6123 Impacted cerumen, bilateral: Secondary | ICD-10-CM | POA: Diagnosis not present

## 2018-05-03 DIAGNOSIS — Z6831 Body mass index (BMI) 31.0-31.9, adult: Secondary | ICD-10-CM | POA: Diagnosis not present

## 2018-05-03 DIAGNOSIS — Z9181 History of falling: Secondary | ICD-10-CM | POA: Diagnosis not present

## 2018-05-03 DIAGNOSIS — Z136 Encounter for screening for cardiovascular disorders: Secondary | ICD-10-CM | POA: Diagnosis not present

## 2018-05-03 DIAGNOSIS — Z139 Encounter for screening, unspecified: Secondary | ICD-10-CM | POA: Diagnosis not present

## 2018-05-03 DIAGNOSIS — Z125 Encounter for screening for malignant neoplasm of prostate: Secondary | ICD-10-CM | POA: Diagnosis not present

## 2018-05-03 DIAGNOSIS — E669 Obesity, unspecified: Secondary | ICD-10-CM | POA: Diagnosis not present

## 2018-05-03 DIAGNOSIS — Z0000 Encounter for general adult medical examination without abnormal findings: Secondary | ICD-10-CM | POA: Diagnosis not present

## 2018-05-03 DIAGNOSIS — E785 Hyperlipidemia, unspecified: Secondary | ICD-10-CM | POA: Diagnosis not present

## 2018-05-03 DIAGNOSIS — Z1331 Encounter for screening for depression: Secondary | ICD-10-CM | POA: Diagnosis not present

## 2018-05-24 DIAGNOSIS — E7800 Pure hypercholesterolemia, unspecified: Secondary | ICD-10-CM | POA: Diagnosis not present

## 2018-05-24 DIAGNOSIS — M109 Gout, unspecified: Secondary | ICD-10-CM | POA: Diagnosis not present

## 2018-05-24 DIAGNOSIS — E119 Type 2 diabetes mellitus without complications: Secondary | ICD-10-CM | POA: Diagnosis not present

## 2018-05-24 DIAGNOSIS — N182 Chronic kidney disease, stage 2 (mild): Secondary | ICD-10-CM | POA: Diagnosis not present

## 2018-05-24 DIAGNOSIS — Z125 Encounter for screening for malignant neoplasm of prostate: Secondary | ICD-10-CM | POA: Diagnosis not present

## 2018-05-26 DIAGNOSIS — E119 Type 2 diabetes mellitus without complications: Secondary | ICD-10-CM | POA: Diagnosis not present

## 2018-05-26 DIAGNOSIS — Z1339 Encounter for screening examination for other mental health and behavioral disorders: Secondary | ICD-10-CM | POA: Diagnosis not present

## 2018-05-26 DIAGNOSIS — N400 Enlarged prostate without lower urinary tract symptoms: Secondary | ICD-10-CM | POA: Diagnosis not present

## 2018-05-26 DIAGNOSIS — I10 Essential (primary) hypertension: Secondary | ICD-10-CM | POA: Diagnosis not present

## 2018-05-26 DIAGNOSIS — I2510 Atherosclerotic heart disease of native coronary artery without angina pectoris: Secondary | ICD-10-CM | POA: Diagnosis not present

## 2018-05-26 DIAGNOSIS — Z1331 Encounter for screening for depression: Secondary | ICD-10-CM | POA: Diagnosis not present

## 2018-05-26 DIAGNOSIS — E7800 Pure hypercholesterolemia, unspecified: Secondary | ICD-10-CM | POA: Diagnosis not present

## 2018-05-26 DIAGNOSIS — M109 Gout, unspecified: Secondary | ICD-10-CM | POA: Diagnosis not present

## 2018-06-03 DIAGNOSIS — C44329 Squamous cell carcinoma of skin of other parts of face: Secondary | ICD-10-CM | POA: Diagnosis not present

## 2018-06-08 DIAGNOSIS — M792 Neuralgia and neuritis, unspecified: Secondary | ICD-10-CM | POA: Diagnosis not present

## 2018-06-08 DIAGNOSIS — Z859 Personal history of malignant neoplasm, unspecified: Secondary | ICD-10-CM | POA: Diagnosis not present

## 2018-06-08 DIAGNOSIS — L578 Other skin changes due to chronic exposure to nonionizing radiation: Secondary | ICD-10-CM | POA: Diagnosis not present

## 2018-06-11 DIAGNOSIS — R930 Abnormal findings on diagnostic imaging of skull and head, not elsewhere classified: Secondary | ICD-10-CM | POA: Diagnosis not present

## 2018-06-11 DIAGNOSIS — M792 Neuralgia and neuritis, unspecified: Secondary | ICD-10-CM | POA: Diagnosis not present

## 2018-06-11 DIAGNOSIS — Z9889 Other specified postprocedural states: Secondary | ICD-10-CM | POA: Diagnosis not present

## 2018-06-11 DIAGNOSIS — Z859 Personal history of malignant neoplasm, unspecified: Secondary | ICD-10-CM | POA: Diagnosis not present

## 2018-06-11 DIAGNOSIS — R51 Headache: Secondary | ICD-10-CM | POA: Diagnosis not present

## 2018-06-25 DIAGNOSIS — C4492 Squamous cell carcinoma of skin, unspecified: Secondary | ICD-10-CM | POA: Diagnosis not present

## 2018-06-30 DIAGNOSIS — C4492 Squamous cell carcinoma of skin, unspecified: Secondary | ICD-10-CM | POA: Diagnosis not present

## 2018-06-30 DIAGNOSIS — I10 Essential (primary) hypertension: Secondary | ICD-10-CM | POA: Diagnosis not present

## 2018-06-30 DIAGNOSIS — Z01818 Encounter for other preprocedural examination: Secondary | ICD-10-CM | POA: Diagnosis not present

## 2018-06-30 DIAGNOSIS — I2510 Atherosclerotic heart disease of native coronary artery without angina pectoris: Secondary | ICD-10-CM | POA: Diagnosis not present

## 2018-06-30 DIAGNOSIS — R918 Other nonspecific abnormal finding of lung field: Secondary | ICD-10-CM | POA: Diagnosis not present

## 2018-06-30 DIAGNOSIS — G4730 Sleep apnea, unspecified: Secondary | ICD-10-CM | POA: Diagnosis not present

## 2018-06-30 DIAGNOSIS — C44329 Squamous cell carcinoma of skin of other parts of face: Secondary | ICD-10-CM | POA: Diagnosis not present

## 2018-06-30 DIAGNOSIS — R791 Abnormal coagulation profile: Secondary | ICD-10-CM | POA: Diagnosis not present

## 2018-07-26 DIAGNOSIS — E119 Type 2 diabetes mellitus without complications: Secondary | ICD-10-CM | POA: Diagnosis not present

## 2018-07-26 DIAGNOSIS — Z87891 Personal history of nicotine dependence: Secondary | ICD-10-CM | POA: Diagnosis not present

## 2018-07-26 DIAGNOSIS — I2510 Atherosclerotic heart disease of native coronary artery without angina pectoris: Secondary | ICD-10-CM | POA: Diagnosis not present

## 2018-07-26 DIAGNOSIS — Q549 Hypospadias, unspecified: Secondary | ICD-10-CM | POA: Diagnosis not present

## 2018-07-26 DIAGNOSIS — I10 Essential (primary) hypertension: Secondary | ICD-10-CM | POA: Diagnosis not present

## 2018-07-26 DIAGNOSIS — C44320 Squamous cell carcinoma of skin of unspecified parts of face: Secondary | ICD-10-CM | POA: Diagnosis not present

## 2018-07-26 DIAGNOSIS — C6961 Malignant neoplasm of right orbit: Secondary | ICD-10-CM | POA: Diagnosis not present

## 2018-07-26 DIAGNOSIS — C44329 Squamous cell carcinoma of skin of other parts of face: Secondary | ICD-10-CM | POA: Diagnosis not present

## 2018-07-26 DIAGNOSIS — C760 Malignant neoplasm of head, face and neck: Secondary | ICD-10-CM | POA: Diagnosis not present

## 2018-07-26 DIAGNOSIS — C4492 Squamous cell carcinoma of skin, unspecified: Secondary | ICD-10-CM | POA: Diagnosis not present

## 2018-07-26 DIAGNOSIS — G4733 Obstructive sleep apnea (adult) (pediatric): Secondary | ICD-10-CM | POA: Diagnosis not present

## 2018-07-26 DIAGNOSIS — C7989 Secondary malignant neoplasm of other specified sites: Secondary | ICD-10-CM | POA: Diagnosis not present

## 2018-07-26 DIAGNOSIS — R339 Retention of urine, unspecified: Secondary | ICD-10-CM | POA: Diagnosis not present

## 2018-08-03 DIAGNOSIS — Z466 Encounter for fitting and adjustment of urinary device: Secondary | ICD-10-CM | POA: Diagnosis not present

## 2018-08-09 DIAGNOSIS — Z9889 Other specified postprocedural states: Secondary | ICD-10-CM | POA: Diagnosis not present

## 2018-08-09 DIAGNOSIS — C760 Malignant neoplasm of head, face and neck: Secondary | ICD-10-CM | POA: Diagnosis not present

## 2018-08-09 DIAGNOSIS — Z87891 Personal history of nicotine dependence: Secondary | ICD-10-CM | POA: Diagnosis not present

## 2018-08-16 DIAGNOSIS — E291 Testicular hypofunction: Secondary | ICD-10-CM | POA: Diagnosis not present

## 2018-08-16 DIAGNOSIS — C44329 Squamous cell carcinoma of skin of other parts of face: Secondary | ICD-10-CM | POA: Diagnosis not present

## 2018-08-16 DIAGNOSIS — C760 Malignant neoplasm of head, face and neck: Secondary | ICD-10-CM | POA: Diagnosis not present

## 2018-08-16 DIAGNOSIS — M2550 Pain in unspecified joint: Secondary | ICD-10-CM | POA: Diagnosis not present

## 2018-08-16 DIAGNOSIS — R6882 Decreased libido: Secondary | ICD-10-CM | POA: Diagnosis not present

## 2018-08-16 DIAGNOSIS — Z9889 Other specified postprocedural states: Secondary | ICD-10-CM | POA: Diagnosis not present

## 2018-08-16 DIAGNOSIS — Z87891 Personal history of nicotine dependence: Secondary | ICD-10-CM | POA: Diagnosis not present

## 2018-08-19 ENCOUNTER — Encounter: Payer: Self-pay | Admitting: *Deleted

## 2018-08-19 ENCOUNTER — Other Ambulatory Visit: Payer: Self-pay | Admitting: *Deleted

## 2018-08-19 NOTE — Patient Outreach (Signed)
  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  Insurance: HTA PPO 2    Outreach attempt # 1  Patient is able to verify HIPAA Reviewed and addressed referral to [facility] with patient  Social: Mr Hidrogo lives at home with his wife, Jeannene Patella, primary caregiver. He is independent with all care needs at this time. 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  He is aware that this is for telephonic contact only     L. Lavina Hamman, RN, BSN, [place] Coordinator Office number [phone] Mobile number [phone]  Main [facility] number [phone] Fax number [phone]

## 2018-08-25 ENCOUNTER — Other Ambulatory Visit: Payer: Self-pay

## 2018-08-26 DIAGNOSIS — C44329 Squamous cell carcinoma of skin of other parts of face: Secondary | ICD-10-CM | POA: Diagnosis not present

## 2018-08-26 NOTE — Patient Outreach (Signed)
Telephone assessment: New referral:  Placed call to patient and explained reason for call. New referral for follow up during radiation treatment.  Patient reports right now he is doing well. Reports he will start radiation at [facility] on 08/30/2018. Reports he has no transportation problems and will have his radiation orientation on 08/30/18.  PLAN: Patient agreed to follow up in 1 week.  Tomasa Rand, RN, BSN, CEN [facility] [employer] [phone]

## 2018-08-30 DIAGNOSIS — C44329 Squamous cell carcinoma of skin of other parts of face: Secondary | ICD-10-CM | POA: Diagnosis not present

## 2018-08-31 DIAGNOSIS — C44329 Squamous cell carcinoma of skin of other parts of face: Secondary | ICD-10-CM | POA: Diagnosis not present

## 2018-09-01 ENCOUNTER — Other Ambulatory Visit: Payer: Self-pay

## 2018-09-01 DIAGNOSIS — C44329 Squamous cell carcinoma of skin of other parts of face: Secondary | ICD-10-CM | POA: Diagnosis not present

## 2018-09-01 NOTE — Patient Outreach (Signed)
Telephone assessment:  Placed call to patient to follow up on the start of radiation this week.  Patient answered the phone and reports that radiation is going well. Reports he is going every day for 2 sessions.   Reports no new concerns today.  PLAN: Follow up call in 1 week.  Tomasa Rand, RN, BSN, CEN [facility] [employer] [phone]

## 2018-09-02 DIAGNOSIS — C44329 Squamous cell carcinoma of skin of other parts of face: Secondary | ICD-10-CM | POA: Diagnosis not present

## 2018-09-03 DIAGNOSIS — C44329 Squamous cell carcinoma of skin of other parts of face: Secondary | ICD-10-CM | POA: Diagnosis not present

## 2018-09-06 DIAGNOSIS — C44329 Squamous cell carcinoma of skin of other parts of face: Secondary | ICD-10-CM | POA: Diagnosis not present

## 2018-09-08 ENCOUNTER — Ambulatory Visit: Payer: Self-pay

## 2018-09-08 DIAGNOSIS — C44329 Squamous cell carcinoma of skin of other parts of face: Secondary | ICD-10-CM | POA: Diagnosis not present

## 2018-09-09 ENCOUNTER — Ambulatory Visit: Payer: Self-pay

## 2018-09-09 DIAGNOSIS — C44329 Squamous cell carcinoma of skin of other parts of face: Secondary | ICD-10-CM | POA: Diagnosis not present

## 2018-09-10 ENCOUNTER — Other Ambulatory Visit: Payer: Self-pay

## 2018-09-10 DIAGNOSIS — Z87891 Personal history of nicotine dependence: Secondary | ICD-10-CM | POA: Diagnosis not present

## 2018-09-10 DIAGNOSIS — C44329 Squamous cell carcinoma of skin of other parts of face: Secondary | ICD-10-CM | POA: Diagnosis not present

## 2018-09-10 DIAGNOSIS — C760 Malignant neoplasm of head, face and neck: Secondary | ICD-10-CM | POA: Diagnosis not present

## 2018-09-10 DIAGNOSIS — Z9889 Other specified postprocedural states: Secondary | ICD-10-CM | POA: Diagnosis not present

## 2018-09-10 NOTE — Patient Outreach (Signed)
Telephone assessment:  Placed call to patient with no answer. Left a message requesting a call back.  PLAN: will call back in 2 business days.  Tomasa Rand, RN, BSN, CEN [facility] [employer] [phone]

## 2018-09-13 ENCOUNTER — Other Ambulatory Visit: Payer: Self-pay

## 2018-09-13 DIAGNOSIS — C44329 Squamous cell carcinoma of skin of other parts of face: Secondary | ICD-10-CM | POA: Diagnosis not present

## 2018-09-13 NOTE — Patient Outreach (Signed)
Telephone assessment: Today's Vitals   09/13/18 1401  Weight: 213 lb (96.6 kg)  Height: 1.778 m (5\' 10" )  PainSc: 7     Placed call to patient, wife answered and gave phone to patient. Patient reports that he is feeling the effects of radiation. Reports sore mouth, raw cheek, sore throat and watery eye.  Reports he will see his radiation doctor tomorrow. Reports he is not able to eat anything hot or cold. He has been eating noodles that are room temperature.  Reports taking tylenol for pain every 4 hours. Reports using oragel for pain control.  Reports recent A1c of 7.4.  Trying his best to stay on diet but hard with mouth issues right now.  CBG range 130-135 fasting.    PLAN: reviewed importance of good nutrition and staying hydrated. Reviewed avoiding sick contacts. Encouraged patient to make a list a questions to discuss with MD tomorrow.  Will plan call in 1 week. Encouraged patient to call me sooner if needed.  Tomasa Rand, RN, BSN, CEN [facility] [employer] [phone]

## 2018-09-14 DIAGNOSIS — C44329 Squamous cell carcinoma of skin of other parts of face: Secondary | ICD-10-CM | POA: Diagnosis not present

## 2018-09-15 DIAGNOSIS — C44329 Squamous cell carcinoma of skin of other parts of face: Secondary | ICD-10-CM | POA: Diagnosis not present

## 2018-09-16 DIAGNOSIS — C44329 Squamous cell carcinoma of skin of other parts of face: Secondary | ICD-10-CM | POA: Diagnosis not present

## 2018-09-20 ENCOUNTER — Other Ambulatory Visit: Payer: Self-pay

## 2018-09-20 DIAGNOSIS — C44329 Squamous cell carcinoma of skin of other parts of face: Secondary | ICD-10-CM | POA: Diagnosis not present

## 2018-09-20 NOTE — Patient Outreach (Signed)
Telephone assessment:  Radiation treatment.  Start of week 4 today.  Placed call to patient who reports he is doing well. Reports the 2 day break helps him.  Patient reports he has already been to radiation today.  Patient reports drinking liquids and is more tolerant of cold liquids.  Patient reports that he is drinking boost, adkins products and carnation instant breakfast.  Reports he was able to mow the yard over the weekend. Reports CBG today of 78.  Patient reports that he is taking tylenol for pain and using viscous lidocaine and baking soda swishes.   PLAN: reviewed importance of trying to keep up nutrition level. Reviewed pain control with tylenol and lidocaine.  Will follow up in 1 week.  Tomasa Rand, RN, BSN, CEN [facility] [employer] [phone]

## 2018-09-22 DIAGNOSIS — C44329 Squamous cell carcinoma of skin of other parts of face: Secondary | ICD-10-CM | POA: Diagnosis not present

## 2018-09-24 DIAGNOSIS — C44329 Squamous cell carcinoma of skin of other parts of face: Secondary | ICD-10-CM | POA: Diagnosis not present

## 2018-09-27 ENCOUNTER — Other Ambulatory Visit: Payer: Self-pay

## 2018-09-27 DIAGNOSIS — C44329 Squamous cell carcinoma of skin of other parts of face: Secondary | ICD-10-CM | POA: Diagnosis not present

## 2018-09-27 NOTE — Patient Outreach (Signed)
Telephone assessment:  Placed weekly call to patient to follow up on current condition. Patient reports that he is hanging in. Reports mouth remains sore but he is keeping it under control. Reports he is feeling some increased weakness. Continues to eat mostly a liquid or soft diet.   Reports recent CBG fasting readings from 75-90.    Denies any new problems or concerns today.Reports about 10 more days left of radiation.  PLAN: follow up call in 1 week.  Tomasa Rand, RN, BSN, CEN [facility] [employer] [phone]

## 2018-09-28 DIAGNOSIS — C44329 Squamous cell carcinoma of skin of other parts of face: Secondary | ICD-10-CM | POA: Diagnosis not present

## 2018-09-29 DIAGNOSIS — C44329 Squamous cell carcinoma of skin of other parts of face: Secondary | ICD-10-CM | POA: Diagnosis not present

## 2018-10-01 DIAGNOSIS — C44329 Squamous cell carcinoma of skin of other parts of face: Secondary | ICD-10-CM | POA: Diagnosis not present

## 2018-10-05 ENCOUNTER — Other Ambulatory Visit: Payer: Self-pay

## 2018-10-05 NOTE — Patient Outreach (Signed)
Telephone assessment:  Placed call to patient with no answer. Left a message and requested a call back. Patient returned call and reports that he is doing ok. States he finished his radiation treatment on 10/01/2018.  Report he continues to have decrease energy, sore month, no taste, decrease appetite and weakness.  Reports he is mostly doing liquids. Boost, glucerna and chicken noodle soup.    Reports CBG running about 90 fasting.   PLAN: will call patient back in 2 week for follow up. If doing well will close case at that time. Encouraged patient to call sooner if needed.  Tomasa Rand, RN, BSN, CEN [facility] [employer] [phone]

## 2018-10-13 DIAGNOSIS — J310 Chronic rhinitis: Secondary | ICD-10-CM | POA: Diagnosis not present

## 2018-10-19 ENCOUNTER — Other Ambulatory Visit: Payer: Self-pay

## 2018-10-19 NOTE — Patient Outreach (Signed)
Telephone follow up/ case closure:  Placed call to patient for 2 week follow up since last radiation treatment.  Patient reports that he is getting better every day. Reports feeling stronger. Taste is coming back slowly. Reports he is doing much better. Has follow up planned in August with oncologist. Reports CBg today of 96.    Denies any new problems or concerns.  Reviewed case closure with patient as all goals are met. Patient is in agreement.   Plan: will send case closure letter to paitent and MD. Encouraged patient to call if needs in the future.   Tomasa Rand, RN, BSN, CEN [facility] [employer] [phone]

## 2018-10-27 DIAGNOSIS — E7800 Pure hypercholesterolemia, unspecified: Secondary | ICD-10-CM | POA: Diagnosis not present

## 2018-10-27 DIAGNOSIS — M109 Gout, unspecified: Secondary | ICD-10-CM | POA: Diagnosis not present

## 2018-10-27 DIAGNOSIS — E119 Type 2 diabetes mellitus without complications: Secondary | ICD-10-CM | POA: Diagnosis not present

## 2018-10-27 DIAGNOSIS — D649 Anemia, unspecified: Secondary | ICD-10-CM | POA: Diagnosis not present

## 2018-11-04 DIAGNOSIS — H40053 Ocular hypertension, bilateral: Secondary | ICD-10-CM | POA: Diagnosis not present

## 2018-11-04 DIAGNOSIS — H11153 Pinguecula, bilateral: Secondary | ICD-10-CM | POA: Diagnosis not present

## 2018-11-04 DIAGNOSIS — H0100A Unspecified blepharitis right eye, upper and lower eyelids: Secondary | ICD-10-CM | POA: Diagnosis not present

## 2018-11-04 DIAGNOSIS — E119 Type 2 diabetes mellitus without complications: Secondary | ICD-10-CM | POA: Diagnosis not present

## 2018-11-04 DIAGNOSIS — H524 Presbyopia: Secondary | ICD-10-CM | POA: Diagnosis not present

## 2018-11-04 DIAGNOSIS — Z7984 Long term (current) use of oral hypoglycemic drugs: Secondary | ICD-10-CM | POA: Diagnosis not present

## 2018-11-04 DIAGNOSIS — H0100B Unspecified blepharitis left eye, upper and lower eyelids: Secondary | ICD-10-CM | POA: Diagnosis not present

## 2018-11-04 DIAGNOSIS — Z961 Presence of intraocular lens: Secondary | ICD-10-CM | POA: Diagnosis not present

## 2018-11-05 DIAGNOSIS — I10 Essential (primary) hypertension: Secondary | ICD-10-CM | POA: Diagnosis not present

## 2018-11-05 DIAGNOSIS — M109 Gout, unspecified: Secondary | ICD-10-CM | POA: Diagnosis not present

## 2018-11-05 DIAGNOSIS — E119 Type 2 diabetes mellitus without complications: Secondary | ICD-10-CM | POA: Diagnosis not present

## 2018-11-05 DIAGNOSIS — I2510 Atherosclerotic heart disease of native coronary artery without angina pectoris: Secondary | ICD-10-CM | POA: Diagnosis not present

## 2018-11-05 DIAGNOSIS — E7800 Pure hypercholesterolemia, unspecified: Secondary | ICD-10-CM | POA: Diagnosis not present

## 2018-12-23 DIAGNOSIS — E7849 Other hyperlipidemia: Secondary | ICD-10-CM | POA: Diagnosis not present

## 2018-12-23 DIAGNOSIS — I10 Essential (primary) hypertension: Secondary | ICD-10-CM | POA: Diagnosis not present

## 2018-12-23 DIAGNOSIS — I2510 Atherosclerotic heart disease of native coronary artery without angina pectoris: Secondary | ICD-10-CM | POA: Diagnosis not present

## 2018-12-23 DIAGNOSIS — I480 Paroxysmal atrial fibrillation: Secondary | ICD-10-CM | POA: Diagnosis not present

## 2019-01-03 DIAGNOSIS — R911 Solitary pulmonary nodule: Secondary | ICD-10-CM | POA: Diagnosis not present

## 2019-01-03 DIAGNOSIS — Z9889 Other specified postprocedural states: Secondary | ICD-10-CM | POA: Diagnosis not present

## 2019-01-03 DIAGNOSIS — Z87891 Personal history of nicotine dependence: Secondary | ICD-10-CM | POA: Diagnosis not present

## 2019-01-03 DIAGNOSIS — C4492 Squamous cell carcinoma of skin, unspecified: Secondary | ICD-10-CM | POA: Diagnosis not present

## 2019-01-03 DIAGNOSIS — C760 Malignant neoplasm of head, face and neck: Secondary | ICD-10-CM | POA: Diagnosis not present

## 2019-01-05 DIAGNOSIS — G4733 Obstructive sleep apnea (adult) (pediatric): Secondary | ICD-10-CM | POA: Diagnosis not present

## 2019-01-05 DIAGNOSIS — I2510 Atherosclerotic heart disease of native coronary artery without angina pectoris: Secondary | ICD-10-CM | POA: Diagnosis not present

## 2019-01-05 DIAGNOSIS — I10 Essential (primary) hypertension: Secondary | ICD-10-CM | POA: Diagnosis not present

## 2019-01-05 DIAGNOSIS — Z7982 Long term (current) use of aspirin: Secondary | ICD-10-CM | POA: Diagnosis not present

## 2019-01-05 DIAGNOSIS — C4492 Squamous cell carcinoma of skin, unspecified: Secondary | ICD-10-CM | POA: Diagnosis not present

## 2019-01-05 DIAGNOSIS — Z87891 Personal history of nicotine dependence: Secondary | ICD-10-CM | POA: Diagnosis not present

## 2019-01-05 DIAGNOSIS — Z7984 Long term (current) use of oral hypoglycemic drugs: Secondary | ICD-10-CM | POA: Diagnosis not present

## 2019-01-05 DIAGNOSIS — J310 Chronic rhinitis: Secondary | ICD-10-CM | POA: Diagnosis not present

## 2019-01-05 DIAGNOSIS — Z7689 Persons encountering health services in other specified circumstances: Secondary | ICD-10-CM | POA: Diagnosis not present

## 2019-01-05 DIAGNOSIS — L905 Scar conditions and fibrosis of skin: Secondary | ICD-10-CM | POA: Diagnosis not present

## 2019-01-05 DIAGNOSIS — E119 Type 2 diabetes mellitus without complications: Secondary | ICD-10-CM | POA: Diagnosis not present

## 2019-01-12 DIAGNOSIS — L570 Actinic keratosis: Secondary | ICD-10-CM | POA: Diagnosis not present

## 2019-01-12 DIAGNOSIS — L814 Other melanin hyperpigmentation: Secondary | ICD-10-CM | POA: Diagnosis not present

## 2019-01-12 DIAGNOSIS — D485 Neoplasm of uncertain behavior of skin: Secondary | ICD-10-CM | POA: Diagnosis not present

## 2019-01-12 DIAGNOSIS — L578 Other skin changes due to chronic exposure to nonionizing radiation: Secondary | ICD-10-CM | POA: Diagnosis not present

## 2019-01-12 DIAGNOSIS — L820 Inflamed seborrheic keratosis: Secondary | ICD-10-CM | POA: Diagnosis not present

## 2019-03-09 DIAGNOSIS — G4733 Obstructive sleep apnea (adult) (pediatric): Secondary | ICD-10-CM | POA: Diagnosis not present

## 2019-03-09 DIAGNOSIS — C44320 Squamous cell carcinoma of skin of unspecified parts of face: Secondary | ICD-10-CM | POA: Diagnosis not present

## 2019-03-09 DIAGNOSIS — R5383 Other fatigue: Secondary | ICD-10-CM | POA: Diagnosis not present

## 2019-03-09 DIAGNOSIS — I2510 Atherosclerotic heart disease of native coronary artery without angina pectoris: Secondary | ICD-10-CM | POA: Diagnosis not present

## 2019-03-09 DIAGNOSIS — I10 Essential (primary) hypertension: Secondary | ICD-10-CM | POA: Diagnosis not present

## 2019-03-09 DIAGNOSIS — Z6827 Body mass index (BMI) 27.0-27.9, adult: Secondary | ICD-10-CM | POA: Diagnosis not present

## 2019-03-09 DIAGNOSIS — E119 Type 2 diabetes mellitus without complications: Secondary | ICD-10-CM | POA: Diagnosis not present

## 2019-03-09 DIAGNOSIS — E7800 Pure hypercholesterolemia, unspecified: Secondary | ICD-10-CM | POA: Diagnosis not present

## 2019-03-09 DIAGNOSIS — M109 Gout, unspecified: Secondary | ICD-10-CM | POA: Diagnosis not present

## 2019-03-18 DIAGNOSIS — C4492 Squamous cell carcinoma of skin, unspecified: Secondary | ICD-10-CM | POA: Diagnosis not present

## 2019-03-18 DIAGNOSIS — J310 Chronic rhinitis: Secondary | ICD-10-CM | POA: Diagnosis not present

## 2019-05-09 DIAGNOSIS — H40053 Ocular hypertension, bilateral: Secondary | ICD-10-CM | POA: Diagnosis not present

## 2019-05-09 DIAGNOSIS — Z125 Encounter for screening for malignant neoplasm of prostate: Secondary | ICD-10-CM | POA: Diagnosis not present

## 2019-05-09 DIAGNOSIS — Z1211 Encounter for screening for malignant neoplasm of colon: Secondary | ICD-10-CM | POA: Diagnosis not present

## 2019-05-09 DIAGNOSIS — Z0000 Encounter for general adult medical examination without abnormal findings: Secondary | ICD-10-CM | POA: Diagnosis not present

## 2019-05-09 DIAGNOSIS — Z9181 History of falling: Secondary | ICD-10-CM | POA: Diagnosis not present

## 2019-05-09 DIAGNOSIS — Z1331 Encounter for screening for depression: Secondary | ICD-10-CM | POA: Diagnosis not present

## 2019-05-09 DIAGNOSIS — E785 Hyperlipidemia, unspecified: Secondary | ICD-10-CM | POA: Diagnosis not present

## 2019-07-04 DIAGNOSIS — C44329 Squamous cell carcinoma of skin of other parts of face: Secondary | ICD-10-CM | POA: Diagnosis not present

## 2019-07-04 DIAGNOSIS — Z08 Encounter for follow-up examination after completed treatment for malignant neoplasm: Secondary | ICD-10-CM | POA: Diagnosis not present

## 2019-07-04 DIAGNOSIS — C4490 Unspecified malignant neoplasm of skin, unspecified: Secondary | ICD-10-CM | POA: Diagnosis not present

## 2019-07-04 DIAGNOSIS — C4492 Squamous cell carcinoma of skin, unspecified: Secondary | ICD-10-CM | POA: Diagnosis not present

## 2019-07-04 DIAGNOSIS — Z923 Personal history of irradiation: Secondary | ICD-10-CM | POA: Diagnosis not present

## 2019-07-06 DIAGNOSIS — C4492 Squamous cell carcinoma of skin, unspecified: Secondary | ICD-10-CM | POA: Diagnosis not present

## 2019-07-06 DIAGNOSIS — J310 Chronic rhinitis: Secondary | ICD-10-CM | POA: Diagnosis not present

## 2019-07-11 DIAGNOSIS — N182 Chronic kidney disease, stage 2 (mild): Secondary | ICD-10-CM | POA: Diagnosis not present

## 2019-07-11 DIAGNOSIS — E119 Type 2 diabetes mellitus without complications: Secondary | ICD-10-CM | POA: Diagnosis not present

## 2019-07-11 DIAGNOSIS — M109 Gout, unspecified: Secondary | ICD-10-CM | POA: Diagnosis not present

## 2019-07-11 DIAGNOSIS — E7800 Pure hypercholesterolemia, unspecified: Secondary | ICD-10-CM | POA: Diagnosis not present

## 2019-07-11 DIAGNOSIS — Z125 Encounter for screening for malignant neoplasm of prostate: Secondary | ICD-10-CM | POA: Diagnosis not present

## 2019-07-14 DIAGNOSIS — B001 Herpesviral vesicular dermatitis: Secondary | ICD-10-CM | POA: Diagnosis not present

## 2019-07-14 DIAGNOSIS — I10 Essential (primary) hypertension: Secondary | ICD-10-CM | POA: Diagnosis not present

## 2019-07-14 DIAGNOSIS — I2510 Atherosclerotic heart disease of native coronary artery without angina pectoris: Secondary | ICD-10-CM | POA: Diagnosis not present

## 2019-07-14 DIAGNOSIS — Z6828 Body mass index (BMI) 28.0-28.9, adult: Secondary | ICD-10-CM | POA: Diagnosis not present

## 2019-07-14 DIAGNOSIS — E7800 Pure hypercholesterolemia, unspecified: Secondary | ICD-10-CM | POA: Diagnosis not present

## 2019-07-14 DIAGNOSIS — E119 Type 2 diabetes mellitus without complications: Secondary | ICD-10-CM | POA: Diagnosis not present

## 2019-07-14 DIAGNOSIS — M109 Gout, unspecified: Secondary | ICD-10-CM | POA: Diagnosis not present

## 2019-07-28 DIAGNOSIS — L578 Other skin changes due to chronic exposure to nonionizing radiation: Secondary | ICD-10-CM | POA: Diagnosis not present

## 2019-07-28 DIAGNOSIS — Z85820 Personal history of malignant melanoma of skin: Secondary | ICD-10-CM | POA: Diagnosis not present

## 2019-07-28 DIAGNOSIS — Z85828 Personal history of other malignant neoplasm of skin: Secondary | ICD-10-CM | POA: Diagnosis not present

## 2019-07-28 DIAGNOSIS — D229 Melanocytic nevi, unspecified: Secondary | ICD-10-CM | POA: Diagnosis not present

## 2019-07-28 DIAGNOSIS — L570 Actinic keratosis: Secondary | ICD-10-CM | POA: Diagnosis not present

## 2019-07-28 DIAGNOSIS — L814 Other melanin hyperpigmentation: Secondary | ICD-10-CM | POA: Diagnosis not present

## 2019-08-25 DIAGNOSIS — Z86010 Personal history of colonic polyps: Secondary | ICD-10-CM | POA: Diagnosis not present

## 2019-08-25 DIAGNOSIS — Z01818 Encounter for other preprocedural examination: Secondary | ICD-10-CM | POA: Diagnosis not present

## 2019-08-31 DIAGNOSIS — I309 Acute pericarditis, unspecified: Secondary | ICD-10-CM | POA: Diagnosis not present

## 2019-08-31 DIAGNOSIS — I2510 Atherosclerotic heart disease of native coronary artery without angina pectoris: Secondary | ICD-10-CM | POA: Diagnosis not present

## 2019-08-31 DIAGNOSIS — I480 Paroxysmal atrial fibrillation: Secondary | ICD-10-CM | POA: Diagnosis not present

## 2019-09-07 DIAGNOSIS — E119 Type 2 diabetes mellitus without complications: Secondary | ICD-10-CM | POA: Diagnosis not present

## 2019-09-07 DIAGNOSIS — Z01818 Encounter for other preprocedural examination: Secondary | ICD-10-CM | POA: Diagnosis not present

## 2019-09-07 DIAGNOSIS — Z1211 Encounter for screening for malignant neoplasm of colon: Secondary | ICD-10-CM | POA: Diagnosis not present

## 2019-09-07 DIAGNOSIS — Z86010 Personal history of colonic polyps: Secondary | ICD-10-CM | POA: Diagnosis not present

## 2019-10-05 DIAGNOSIS — C4492 Squamous cell carcinoma of skin, unspecified: Secondary | ICD-10-CM | POA: Diagnosis not present

## 2019-11-09 DIAGNOSIS — H5212 Myopia, left eye: Secondary | ICD-10-CM | POA: Diagnosis not present

## 2019-11-09 DIAGNOSIS — H52203 Unspecified astigmatism, bilateral: Secondary | ICD-10-CM | POA: Diagnosis not present

## 2019-11-09 DIAGNOSIS — H11153 Pinguecula, bilateral: Secondary | ICD-10-CM | POA: Diagnosis not present

## 2019-11-09 DIAGNOSIS — H0100B Unspecified blepharitis left eye, upper and lower eyelids: Secondary | ICD-10-CM | POA: Diagnosis not present

## 2019-11-09 DIAGNOSIS — Z794 Long term (current) use of insulin: Secondary | ICD-10-CM | POA: Diagnosis not present

## 2019-11-09 DIAGNOSIS — H0100A Unspecified blepharitis right eye, upper and lower eyelids: Secondary | ICD-10-CM | POA: Diagnosis not present

## 2019-11-09 DIAGNOSIS — Z961 Presence of intraocular lens: Secondary | ICD-10-CM | POA: Diagnosis not present

## 2019-11-09 DIAGNOSIS — H524 Presbyopia: Secondary | ICD-10-CM | POA: Diagnosis not present

## 2019-11-09 DIAGNOSIS — E119 Type 2 diabetes mellitus without complications: Secondary | ICD-10-CM | POA: Diagnosis not present

## 2019-11-09 DIAGNOSIS — H40053 Ocular hypertension, bilateral: Secondary | ICD-10-CM | POA: Diagnosis not present

## 2019-11-21 DIAGNOSIS — N182 Chronic kidney disease, stage 2 (mild): Secondary | ICD-10-CM | POA: Diagnosis not present

## 2019-11-21 DIAGNOSIS — E119 Type 2 diabetes mellitus without complications: Secondary | ICD-10-CM | POA: Diagnosis not present

## 2019-11-21 DIAGNOSIS — M109 Gout, unspecified: Secondary | ICD-10-CM | POA: Diagnosis not present

## 2019-11-21 DIAGNOSIS — E7800 Pure hypercholesterolemia, unspecified: Secondary | ICD-10-CM | POA: Diagnosis not present

## 2019-11-23 DIAGNOSIS — I2510 Atherosclerotic heart disease of native coronary artery without angina pectoris: Secondary | ICD-10-CM | POA: Diagnosis not present

## 2019-11-23 DIAGNOSIS — M720 Palmar fascial fibromatosis [Dupuytren]: Secondary | ICD-10-CM | POA: Diagnosis not present

## 2019-11-23 DIAGNOSIS — M109 Gout, unspecified: Secondary | ICD-10-CM | POA: Diagnosis not present

## 2019-11-23 DIAGNOSIS — E7800 Pure hypercholesterolemia, unspecified: Secondary | ICD-10-CM | POA: Diagnosis not present

## 2019-11-23 DIAGNOSIS — E119 Type 2 diabetes mellitus without complications: Secondary | ICD-10-CM | POA: Diagnosis not present

## 2019-11-23 DIAGNOSIS — Z139 Encounter for screening, unspecified: Secondary | ICD-10-CM | POA: Diagnosis not present

## 2019-11-23 DIAGNOSIS — B001 Herpesviral vesicular dermatitis: Secondary | ICD-10-CM | POA: Diagnosis not present

## 2019-11-23 DIAGNOSIS — Z6829 Body mass index (BMI) 29.0-29.9, adult: Secondary | ICD-10-CM | POA: Diagnosis not present

## 2019-11-23 DIAGNOSIS — I10 Essential (primary) hypertension: Secondary | ICD-10-CM | POA: Diagnosis not present

## 2019-12-06 ENCOUNTER — Other Ambulatory Visit: Payer: Self-pay

## 2020-01-09 DIAGNOSIS — C4492 Squamous cell carcinoma of skin, unspecified: Secondary | ICD-10-CM | POA: Diagnosis not present

## 2020-01-09 DIAGNOSIS — Z85828 Personal history of other malignant neoplasm of skin: Secondary | ICD-10-CM | POA: Diagnosis not present

## 2020-01-09 DIAGNOSIS — Z08 Encounter for follow-up examination after completed treatment for malignant neoplasm: Secondary | ICD-10-CM | POA: Diagnosis not present

## 2020-01-09 DIAGNOSIS — Z923 Personal history of irradiation: Secondary | ICD-10-CM | POA: Diagnosis not present

## 2020-02-02 DIAGNOSIS — J310 Chronic rhinitis: Secondary | ICD-10-CM | POA: Diagnosis not present

## 2020-03-21 DIAGNOSIS — E119 Type 2 diabetes mellitus without complications: Secondary | ICD-10-CM | POA: Diagnosis not present

## 2020-03-21 DIAGNOSIS — I2510 Atherosclerotic heart disease of native coronary artery without angina pectoris: Secondary | ICD-10-CM | POA: Diagnosis not present

## 2020-03-21 DIAGNOSIS — I480 Paroxysmal atrial fibrillation: Secondary | ICD-10-CM | POA: Diagnosis not present

## 2020-03-21 DIAGNOSIS — R0600 Dyspnea, unspecified: Secondary | ICD-10-CM | POA: Diagnosis not present

## 2020-03-21 DIAGNOSIS — I10 Essential (primary) hypertension: Secondary | ICD-10-CM | POA: Diagnosis not present

## 2020-03-26 DIAGNOSIS — I10 Essential (primary) hypertension: Secondary | ICD-10-CM | POA: Diagnosis not present

## 2020-03-30 DIAGNOSIS — Z6829 Body mass index (BMI) 29.0-29.9, adult: Secondary | ICD-10-CM | POA: Diagnosis not present

## 2020-03-30 DIAGNOSIS — E119 Type 2 diabetes mellitus without complications: Secondary | ICD-10-CM | POA: Diagnosis not present

## 2020-03-30 DIAGNOSIS — N182 Chronic kidney disease, stage 2 (mild): Secondary | ICD-10-CM | POA: Diagnosis not present

## 2020-03-30 DIAGNOSIS — D229 Melanocytic nevi, unspecified: Secondary | ICD-10-CM | POA: Diagnosis not present

## 2020-03-30 DIAGNOSIS — I2510 Atherosclerotic heart disease of native coronary artery without angina pectoris: Secondary | ICD-10-CM | POA: Diagnosis not present

## 2020-03-30 DIAGNOSIS — L570 Actinic keratosis: Secondary | ICD-10-CM | POA: Diagnosis not present

## 2020-03-30 DIAGNOSIS — G4733 Obstructive sleep apnea (adult) (pediatric): Secondary | ICD-10-CM | POA: Diagnosis not present

## 2020-03-30 DIAGNOSIS — M109 Gout, unspecified: Secondary | ICD-10-CM | POA: Diagnosis not present

## 2020-03-30 DIAGNOSIS — L821 Other seborrheic keratosis: Secondary | ICD-10-CM | POA: Diagnosis not present

## 2020-03-30 DIAGNOSIS — Z85820 Personal history of malignant melanoma of skin: Secondary | ICD-10-CM | POA: Diagnosis not present

## 2020-03-30 DIAGNOSIS — Z85828 Personal history of other malignant neoplasm of skin: Secondary | ICD-10-CM | POA: Diagnosis not present

## 2020-03-30 DIAGNOSIS — L814 Other melanin hyperpigmentation: Secondary | ICD-10-CM | POA: Diagnosis not present

## 2020-03-30 DIAGNOSIS — D692 Other nonthrombocytopenic purpura: Secondary | ICD-10-CM | POA: Diagnosis not present

## 2020-03-30 DIAGNOSIS — E7800 Pure hypercholesterolemia, unspecified: Secondary | ICD-10-CM | POA: Diagnosis not present

## 2020-05-08 DIAGNOSIS — R0602 Shortness of breath: Secondary | ICD-10-CM | POA: Diagnosis not present

## 2020-06-06 DIAGNOSIS — Z20822 Contact with and (suspected) exposure to covid-19: Secondary | ICD-10-CM | POA: Diagnosis not present

## 2020-06-06 DIAGNOSIS — J111 Influenza due to unidentified influenza virus with other respiratory manifestations: Secondary | ICD-10-CM | POA: Diagnosis not present

## 2020-07-09 DIAGNOSIS — Z08 Encounter for follow-up examination after completed treatment for malignant neoplasm: Secondary | ICD-10-CM | POA: Diagnosis not present

## 2020-07-09 DIAGNOSIS — C4492 Squamous cell carcinoma of skin, unspecified: Secondary | ICD-10-CM | POA: Diagnosis not present

## 2020-07-09 DIAGNOSIS — Z923 Personal history of irradiation: Secondary | ICD-10-CM | POA: Diagnosis not present

## 2020-08-01 DIAGNOSIS — E7800 Pure hypercholesterolemia, unspecified: Secondary | ICD-10-CM | POA: Diagnosis not present

## 2020-08-01 DIAGNOSIS — Z125 Encounter for screening for malignant neoplasm of prostate: Secondary | ICD-10-CM | POA: Diagnosis not present

## 2020-08-01 DIAGNOSIS — E119 Type 2 diabetes mellitus without complications: Secondary | ICD-10-CM | POA: Diagnosis not present

## 2020-08-01 DIAGNOSIS — N182 Chronic kidney disease, stage 2 (mild): Secondary | ICD-10-CM | POA: Diagnosis not present

## 2020-08-01 DIAGNOSIS — M109 Gout, unspecified: Secondary | ICD-10-CM | POA: Diagnosis not present

## 2020-08-03 DIAGNOSIS — N182 Chronic kidney disease, stage 2 (mild): Secondary | ICD-10-CM | POA: Diagnosis not present

## 2020-08-03 DIAGNOSIS — E7800 Pure hypercholesterolemia, unspecified: Secondary | ICD-10-CM | POA: Diagnosis not present

## 2020-08-03 DIAGNOSIS — Z6828 Body mass index (BMI) 28.0-28.9, adult: Secondary | ICD-10-CM | POA: Diagnosis not present

## 2020-08-03 DIAGNOSIS — R5383 Other fatigue: Secondary | ICD-10-CM | POA: Diagnosis not present

## 2020-08-03 DIAGNOSIS — M109 Gout, unspecified: Secondary | ICD-10-CM | POA: Diagnosis not present

## 2020-08-03 DIAGNOSIS — I2510 Atherosclerotic heart disease of native coronary artery without angina pectoris: Secondary | ICD-10-CM | POA: Diagnosis not present

## 2020-08-03 DIAGNOSIS — E119 Type 2 diabetes mellitus without complications: Secondary | ICD-10-CM | POA: Diagnosis not present

## 2020-08-03 DIAGNOSIS — Z1331 Encounter for screening for depression: Secondary | ICD-10-CM | POA: Diagnosis not present

## 2020-08-03 DIAGNOSIS — R42 Dizziness and giddiness: Secondary | ICD-10-CM | POA: Diagnosis not present

## 2020-08-03 DIAGNOSIS — G4733 Obstructive sleep apnea (adult) (pediatric): Secondary | ICD-10-CM | POA: Diagnosis not present

## 2020-08-03 DIAGNOSIS — I6529 Occlusion and stenosis of unspecified carotid artery: Secondary | ICD-10-CM | POA: Diagnosis not present

## 2020-08-03 DIAGNOSIS — Z9181 History of falling: Secondary | ICD-10-CM | POA: Diagnosis not present

## 2020-08-07 DIAGNOSIS — I6529 Occlusion and stenosis of unspecified carotid artery: Secondary | ICD-10-CM | POA: Diagnosis not present

## 2020-08-07 DIAGNOSIS — I708 Atherosclerosis of other arteries: Secondary | ICD-10-CM | POA: Diagnosis not present

## 2020-08-07 DIAGNOSIS — I6523 Occlusion and stenosis of bilateral carotid arteries: Secondary | ICD-10-CM | POA: Diagnosis not present

## 2020-08-07 DIAGNOSIS — I771 Stricture of artery: Secondary | ICD-10-CM | POA: Diagnosis not present

## 2020-08-10 DIAGNOSIS — R9389 Abnormal findings on diagnostic imaging of other specified body structures: Secondary | ICD-10-CM | POA: Diagnosis not present

## 2020-08-10 DIAGNOSIS — J310 Chronic rhinitis: Secondary | ICD-10-CM | POA: Diagnosis not present

## 2020-08-10 DIAGNOSIS — Z8589 Personal history of malignant neoplasm of other organs and systems: Secondary | ICD-10-CM | POA: Diagnosis not present

## 2020-09-28 DIAGNOSIS — D1801 Hemangioma of skin and subcutaneous tissue: Secondary | ICD-10-CM | POA: Diagnosis not present

## 2020-09-28 DIAGNOSIS — L820 Inflamed seborrheic keratosis: Secondary | ICD-10-CM | POA: Diagnosis not present

## 2020-09-28 DIAGNOSIS — Z86007 Personal history of in-situ neoplasm of skin: Secondary | ICD-10-CM | POA: Diagnosis not present

## 2020-09-28 DIAGNOSIS — Z85828 Personal history of other malignant neoplasm of skin: Secondary | ICD-10-CM | POA: Diagnosis not present

## 2020-09-28 DIAGNOSIS — L814 Other melanin hyperpigmentation: Secondary | ICD-10-CM | POA: Diagnosis not present

## 2020-09-28 DIAGNOSIS — L821 Other seborrheic keratosis: Secondary | ICD-10-CM | POA: Diagnosis not present

## 2020-09-28 DIAGNOSIS — L570 Actinic keratosis: Secondary | ICD-10-CM | POA: Diagnosis not present

## 2020-09-28 DIAGNOSIS — L84 Corns and callosities: Secondary | ICD-10-CM | POA: Diagnosis not present

## 2020-10-03 DIAGNOSIS — R5383 Other fatigue: Secondary | ICD-10-CM | POA: Diagnosis not present

## 2020-10-03 DIAGNOSIS — G4733 Obstructive sleep apnea (adult) (pediatric): Secondary | ICD-10-CM | POA: Diagnosis not present

## 2020-10-03 DIAGNOSIS — I2510 Atherosclerotic heart disease of native coronary artery without angina pectoris: Secondary | ICD-10-CM | POA: Diagnosis not present

## 2020-10-03 DIAGNOSIS — N400 Enlarged prostate without lower urinary tract symptoms: Secondary | ICD-10-CM | POA: Diagnosis not present

## 2020-10-03 DIAGNOSIS — I10 Essential (primary) hypertension: Secondary | ICD-10-CM | POA: Diagnosis not present

## 2020-10-03 DIAGNOSIS — Z6828 Body mass index (BMI) 28.0-28.9, adult: Secondary | ICD-10-CM | POA: Diagnosis not present

## 2020-10-03 DIAGNOSIS — E119 Type 2 diabetes mellitus without complications: Secondary | ICD-10-CM | POA: Diagnosis not present

## 2020-10-03 DIAGNOSIS — K5909 Other constipation: Secondary | ICD-10-CM | POA: Diagnosis not present

## 2020-10-24 DIAGNOSIS — M5416 Radiculopathy, lumbar region: Secondary | ICD-10-CM | POA: Diagnosis not present

## 2020-11-08 DIAGNOSIS — Z6829 Body mass index (BMI) 29.0-29.9, adult: Secondary | ICD-10-CM | POA: Diagnosis not present

## 2020-11-08 DIAGNOSIS — I10 Essential (primary) hypertension: Secondary | ICD-10-CM | POA: Diagnosis not present

## 2020-11-08 DIAGNOSIS — N390 Urinary tract infection, site not specified: Secondary | ICD-10-CM | POA: Diagnosis not present

## 2020-11-08 DIAGNOSIS — R319 Hematuria, unspecified: Secondary | ICD-10-CM | POA: Diagnosis not present

## 2020-11-16 DIAGNOSIS — Z6827 Body mass index (BMI) 27.0-27.9, adult: Secondary | ICD-10-CM | POA: Diagnosis not present

## 2020-11-16 DIAGNOSIS — I10 Essential (primary) hypertension: Secondary | ICD-10-CM | POA: Diagnosis not present

## 2020-11-16 DIAGNOSIS — M5416 Radiculopathy, lumbar region: Secondary | ICD-10-CM | POA: Diagnosis not present

## 2020-11-19 DIAGNOSIS — Z961 Presence of intraocular lens: Secondary | ICD-10-CM | POA: Diagnosis not present

## 2020-11-19 DIAGNOSIS — H18413 Arcus senilis, bilateral: Secondary | ICD-10-CM | POA: Diagnosis not present

## 2020-11-19 DIAGNOSIS — H0288A Meibomian gland dysfunction right eye, upper and lower eyelids: Secondary | ICD-10-CM | POA: Diagnosis not present

## 2020-11-19 DIAGNOSIS — H524 Presbyopia: Secondary | ICD-10-CM | POA: Diagnosis not present

## 2020-11-19 DIAGNOSIS — H0288B Meibomian gland dysfunction left eye, upper and lower eyelids: Secondary | ICD-10-CM | POA: Diagnosis not present

## 2020-11-19 DIAGNOSIS — H40053 Ocular hypertension, bilateral: Secondary | ICD-10-CM | POA: Diagnosis not present

## 2020-11-19 DIAGNOSIS — E119 Type 2 diabetes mellitus without complications: Secondary | ICD-10-CM | POA: Diagnosis not present

## 2020-11-19 DIAGNOSIS — H02202 Unspecified lagophthalmos right lower eyelid: Secondary | ICD-10-CM | POA: Diagnosis not present

## 2020-11-19 DIAGNOSIS — H52203 Unspecified astigmatism, bilateral: Secondary | ICD-10-CM | POA: Diagnosis not present

## 2020-11-19 DIAGNOSIS — H11153 Pinguecula, bilateral: Secondary | ICD-10-CM | POA: Diagnosis not present

## 2020-11-19 DIAGNOSIS — Z794 Long term (current) use of insulin: Secondary | ICD-10-CM | POA: Diagnosis not present

## 2020-11-19 DIAGNOSIS — H02205 Unspecified lagophthalmos left lower eyelid: Secondary | ICD-10-CM | POA: Diagnosis not present

## 2020-11-22 DIAGNOSIS — R319 Hematuria, unspecified: Secondary | ICD-10-CM | POA: Diagnosis not present

## 2020-11-27 DIAGNOSIS — M545 Low back pain, unspecified: Secondary | ICD-10-CM | POA: Diagnosis not present

## 2020-11-27 DIAGNOSIS — M5416 Radiculopathy, lumbar region: Secondary | ICD-10-CM | POA: Diagnosis not present

## 2020-12-12 DIAGNOSIS — I10 Essential (primary) hypertension: Secondary | ICD-10-CM | POA: Diagnosis not present

## 2020-12-12 DIAGNOSIS — M5416 Radiculopathy, lumbar region: Secondary | ICD-10-CM | POA: Diagnosis not present

## 2020-12-12 DIAGNOSIS — Z6828 Body mass index (BMI) 28.0-28.9, adult: Secondary | ICD-10-CM | POA: Diagnosis not present

## 2021-01-04 DIAGNOSIS — N182 Chronic kidney disease, stage 2 (mild): Secondary | ICD-10-CM | POA: Diagnosis not present

## 2021-01-04 DIAGNOSIS — G4733 Obstructive sleep apnea (adult) (pediatric): Secondary | ICD-10-CM | POA: Diagnosis not present

## 2021-01-04 DIAGNOSIS — I2510 Atherosclerotic heart disease of native coronary artery without angina pectoris: Secondary | ICD-10-CM | POA: Diagnosis not present

## 2021-01-04 DIAGNOSIS — Z139 Encounter for screening, unspecified: Secondary | ICD-10-CM | POA: Diagnosis not present

## 2021-01-04 DIAGNOSIS — Z6830 Body mass index (BMI) 30.0-30.9, adult: Secondary | ICD-10-CM | POA: Diagnosis not present

## 2021-01-04 DIAGNOSIS — E1159 Type 2 diabetes mellitus with other circulatory complications: Secondary | ICD-10-CM | POA: Diagnosis not present

## 2021-01-04 DIAGNOSIS — R5383 Other fatigue: Secondary | ICD-10-CM | POA: Diagnosis not present

## 2021-01-04 DIAGNOSIS — E7800 Pure hypercholesterolemia, unspecified: Secondary | ICD-10-CM | POA: Diagnosis not present

## 2021-01-04 DIAGNOSIS — M109 Gout, unspecified: Secondary | ICD-10-CM | POA: Diagnosis not present

## 2021-01-04 DIAGNOSIS — I10 Essential (primary) hypertension: Secondary | ICD-10-CM | POA: Diagnosis not present

## 2021-03-20 DIAGNOSIS — Z08 Encounter for follow-up examination after completed treatment for malignant neoplasm: Secondary | ICD-10-CM | POA: Diagnosis not present

## 2021-03-20 DIAGNOSIS — C4492 Squamous cell carcinoma of skin, unspecified: Secondary | ICD-10-CM | POA: Diagnosis not present

## 2021-03-20 DIAGNOSIS — Z9889 Other specified postprocedural states: Secondary | ICD-10-CM | POA: Diagnosis not present

## 2021-03-20 DIAGNOSIS — Z8589 Personal history of malignant neoplasm of other organs and systems: Secondary | ICD-10-CM | POA: Diagnosis not present

## 2021-03-20 DIAGNOSIS — Z85828 Personal history of other malignant neoplasm of skin: Secondary | ICD-10-CM | POA: Diagnosis not present

## 2021-03-20 DIAGNOSIS — I2510 Atherosclerotic heart disease of native coronary artery without angina pectoris: Secondary | ICD-10-CM | POA: Diagnosis not present

## 2021-03-20 DIAGNOSIS — R9389 Abnormal findings on diagnostic imaging of other specified body structures: Secondary | ICD-10-CM | POA: Diagnosis not present

## 2021-03-27 DIAGNOSIS — I2510 Atherosclerotic heart disease of native coronary artery without angina pectoris: Secondary | ICD-10-CM | POA: Diagnosis not present

## 2021-03-27 DIAGNOSIS — I10 Essential (primary) hypertension: Secondary | ICD-10-CM | POA: Diagnosis not present

## 2021-03-27 DIAGNOSIS — I480 Paroxysmal atrial fibrillation: Secondary | ICD-10-CM | POA: Diagnosis not present

## 2021-04-10 DIAGNOSIS — I10 Essential (primary) hypertension: Secondary | ICD-10-CM | POA: Diagnosis not present

## 2021-04-10 DIAGNOSIS — Z6830 Body mass index (BMI) 30.0-30.9, adult: Secondary | ICD-10-CM | POA: Diagnosis not present

## 2021-04-10 DIAGNOSIS — N400 Enlarged prostate without lower urinary tract symptoms: Secondary | ICD-10-CM | POA: Diagnosis not present

## 2021-04-10 DIAGNOSIS — Z23 Encounter for immunization: Secondary | ICD-10-CM | POA: Diagnosis not present

## 2021-04-10 DIAGNOSIS — I6529 Occlusion and stenosis of unspecified carotid artery: Secondary | ICD-10-CM | POA: Diagnosis not present

## 2021-04-10 DIAGNOSIS — G4733 Obstructive sleep apnea (adult) (pediatric): Secondary | ICD-10-CM | POA: Diagnosis not present

## 2021-04-10 DIAGNOSIS — E1159 Type 2 diabetes mellitus with other circulatory complications: Secondary | ICD-10-CM | POA: Diagnosis not present

## 2021-04-10 DIAGNOSIS — I2510 Atherosclerotic heart disease of native coronary artery without angina pectoris: Secondary | ICD-10-CM | POA: Diagnosis not present

## 2021-07-01 DIAGNOSIS — L821 Other seborrheic keratosis: Secondary | ICD-10-CM | POA: Diagnosis not present

## 2021-07-01 DIAGNOSIS — Z85820 Personal history of malignant melanoma of skin: Secondary | ICD-10-CM | POA: Diagnosis not present

## 2021-07-01 DIAGNOSIS — D229 Melanocytic nevi, unspecified: Secondary | ICD-10-CM | POA: Diagnosis not present

## 2021-07-01 DIAGNOSIS — Z85828 Personal history of other malignant neoplasm of skin: Secondary | ICD-10-CM | POA: Diagnosis not present

## 2021-07-01 DIAGNOSIS — L814 Other melanin hyperpigmentation: Secondary | ICD-10-CM | POA: Diagnosis not present

## 2021-07-01 DIAGNOSIS — L570 Actinic keratosis: Secondary | ICD-10-CM | POA: Diagnosis not present

## 2021-07-09 DIAGNOSIS — N182 Chronic kidney disease, stage 2 (mild): Secondary | ICD-10-CM | POA: Diagnosis not present

## 2021-07-09 DIAGNOSIS — E7800 Pure hypercholesterolemia, unspecified: Secondary | ICD-10-CM | POA: Diagnosis not present

## 2021-07-09 DIAGNOSIS — E1159 Type 2 diabetes mellitus with other circulatory complications: Secondary | ICD-10-CM | POA: Diagnosis not present

## 2021-07-09 DIAGNOSIS — M109 Gout, unspecified: Secondary | ICD-10-CM | POA: Diagnosis not present

## 2021-07-09 DIAGNOSIS — I10 Essential (primary) hypertension: Secondary | ICD-10-CM | POA: Diagnosis not present

## 2021-07-09 DIAGNOSIS — Z79899 Other long term (current) drug therapy: Secondary | ICD-10-CM | POA: Diagnosis not present

## 2021-07-09 DIAGNOSIS — M545 Low back pain, unspecified: Secondary | ICD-10-CM | POA: Diagnosis not present

## 2021-07-09 DIAGNOSIS — G4733 Obstructive sleep apnea (adult) (pediatric): Secondary | ICD-10-CM | POA: Diagnosis not present

## 2021-07-09 DIAGNOSIS — Z6831 Body mass index (BMI) 31.0-31.9, adult: Secondary | ICD-10-CM | POA: Diagnosis not present

## 2021-07-09 DIAGNOSIS — I2510 Atherosclerotic heart disease of native coronary artery without angina pectoris: Secondary | ICD-10-CM | POA: Diagnosis not present

## 2021-09-18 DIAGNOSIS — G4733 Obstructive sleep apnea (adult) (pediatric): Secondary | ICD-10-CM | POA: Diagnosis not present

## 2021-09-20 DIAGNOSIS — Z8589 Personal history of malignant neoplasm of other organs and systems: Secondary | ICD-10-CM | POA: Diagnosis not present

## 2021-09-20 DIAGNOSIS — C4492 Squamous cell carcinoma of skin, unspecified: Secondary | ICD-10-CM | POA: Diagnosis not present

## 2021-09-23 DIAGNOSIS — Z0000 Encounter for general adult medical examination without abnormal findings: Secondary | ICD-10-CM | POA: Diagnosis not present

## 2021-09-23 DIAGNOSIS — E785 Hyperlipidemia, unspecified: Secondary | ICD-10-CM | POA: Diagnosis not present

## 2021-09-23 DIAGNOSIS — E669 Obesity, unspecified: Secondary | ICD-10-CM | POA: Diagnosis not present

## 2021-09-23 DIAGNOSIS — Z1331 Encounter for screening for depression: Secondary | ICD-10-CM | POA: Diagnosis not present

## 2021-09-23 DIAGNOSIS — Z6831 Body mass index (BMI) 31.0-31.9, adult: Secondary | ICD-10-CM | POA: Diagnosis not present

## 2021-09-23 DIAGNOSIS — Z9181 History of falling: Secondary | ICD-10-CM | POA: Diagnosis not present

## 2021-10-09 DIAGNOSIS — I6529 Occlusion and stenosis of unspecified carotid artery: Secondary | ICD-10-CM | POA: Diagnosis not present

## 2021-10-09 DIAGNOSIS — G4733 Obstructive sleep apnea (adult) (pediatric): Secondary | ICD-10-CM | POA: Diagnosis not present

## 2021-10-09 DIAGNOSIS — I10 Essential (primary) hypertension: Secondary | ICD-10-CM | POA: Diagnosis not present

## 2021-10-09 DIAGNOSIS — E1159 Type 2 diabetes mellitus with other circulatory complications: Secondary | ICD-10-CM | POA: Diagnosis not present

## 2021-10-09 DIAGNOSIS — Z8669 Personal history of other diseases of the nervous system and sense organs: Secondary | ICD-10-CM | POA: Diagnosis not present

## 2021-10-09 DIAGNOSIS — I2510 Atherosclerotic heart disease of native coronary artery without angina pectoris: Secondary | ICD-10-CM | POA: Diagnosis not present

## 2021-10-09 DIAGNOSIS — N400 Enlarged prostate without lower urinary tract symptoms: Secondary | ICD-10-CM | POA: Diagnosis not present

## 2021-10-19 DIAGNOSIS — G4733 Obstructive sleep apnea (adult) (pediatric): Secondary | ICD-10-CM | POA: Diagnosis not present

## 2021-10-31 DIAGNOSIS — S50811A Abrasion of right forearm, initial encounter: Secondary | ICD-10-CM | POA: Diagnosis not present

## 2021-10-31 DIAGNOSIS — M109 Gout, unspecified: Secondary | ICD-10-CM | POA: Diagnosis not present

## 2021-11-18 DIAGNOSIS — G4733 Obstructive sleep apnea (adult) (pediatric): Secondary | ICD-10-CM | POA: Diagnosis not present

## 2021-11-26 DIAGNOSIS — H02202 Unspecified lagophthalmos right lower eyelid: Secondary | ICD-10-CM | POA: Diagnosis not present

## 2021-11-26 DIAGNOSIS — H02205 Unspecified lagophthalmos left lower eyelid: Secondary | ICD-10-CM | POA: Diagnosis not present

## 2021-11-26 DIAGNOSIS — H52203 Unspecified astigmatism, bilateral: Secondary | ICD-10-CM | POA: Diagnosis not present

## 2021-11-26 DIAGNOSIS — Z794 Long term (current) use of insulin: Secondary | ICD-10-CM | POA: Diagnosis not present

## 2021-11-26 DIAGNOSIS — H0288A Meibomian gland dysfunction right eye, upper and lower eyelids: Secondary | ICD-10-CM | POA: Diagnosis not present

## 2021-11-26 DIAGNOSIS — H524 Presbyopia: Secondary | ICD-10-CM | POA: Diagnosis not present

## 2021-11-26 DIAGNOSIS — H11153 Pinguecula, bilateral: Secondary | ICD-10-CM | POA: Diagnosis not present

## 2021-11-26 DIAGNOSIS — Z961 Presence of intraocular lens: Secondary | ICD-10-CM | POA: Diagnosis not present

## 2021-11-26 DIAGNOSIS — H40053 Ocular hypertension, bilateral: Secondary | ICD-10-CM | POA: Diagnosis not present

## 2021-11-26 DIAGNOSIS — E119 Type 2 diabetes mellitus without complications: Secondary | ICD-10-CM | POA: Diagnosis not present

## 2021-11-26 DIAGNOSIS — H18413 Arcus senilis, bilateral: Secondary | ICD-10-CM | POA: Diagnosis not present

## 2021-11-26 DIAGNOSIS — H0288B Meibomian gland dysfunction left eye, upper and lower eyelids: Secondary | ICD-10-CM | POA: Diagnosis not present

## 2021-12-12 DIAGNOSIS — L821 Other seborrheic keratosis: Secondary | ICD-10-CM | POA: Diagnosis not present

## 2021-12-12 DIAGNOSIS — L570 Actinic keratosis: Secondary | ICD-10-CM | POA: Diagnosis not present

## 2021-12-12 DIAGNOSIS — D225 Melanocytic nevi of trunk: Secondary | ICD-10-CM | POA: Diagnosis not present

## 2021-12-12 DIAGNOSIS — C44329 Squamous cell carcinoma of skin of other parts of face: Secondary | ICD-10-CM | POA: Diagnosis not present

## 2021-12-12 DIAGNOSIS — C4359 Malignant melanoma of other part of trunk: Secondary | ICD-10-CM | POA: Diagnosis not present

## 2021-12-19 DIAGNOSIS — G4733 Obstructive sleep apnea (adult) (pediatric): Secondary | ICD-10-CM | POA: Diagnosis not present

## 2022-01-14 DIAGNOSIS — E1159 Type 2 diabetes mellitus with other circulatory complications: Secondary | ICD-10-CM | POA: Diagnosis not present

## 2022-01-14 DIAGNOSIS — M109 Gout, unspecified: Secondary | ICD-10-CM | POA: Diagnosis not present

## 2022-01-14 DIAGNOSIS — N182 Chronic kidney disease, stage 2 (mild): Secondary | ICD-10-CM | POA: Diagnosis not present

## 2022-01-14 DIAGNOSIS — E7800 Pure hypercholesterolemia, unspecified: Secondary | ICD-10-CM | POA: Diagnosis not present

## 2022-01-14 DIAGNOSIS — Z139 Encounter for screening, unspecified: Secondary | ICD-10-CM | POA: Diagnosis not present

## 2022-01-14 DIAGNOSIS — D649 Anemia, unspecified: Secondary | ICD-10-CM | POA: Diagnosis not present

## 2022-01-14 DIAGNOSIS — I2510 Atherosclerotic heart disease of native coronary artery without angina pectoris: Secondary | ICD-10-CM | POA: Diagnosis not present

## 2022-01-14 DIAGNOSIS — H6123 Impacted cerumen, bilateral: Secondary | ICD-10-CM | POA: Diagnosis not present

## 2022-01-14 DIAGNOSIS — I498 Other specified cardiac arrhythmias: Secondary | ICD-10-CM | POA: Diagnosis not present

## 2022-01-14 DIAGNOSIS — G4733 Obstructive sleep apnea (adult) (pediatric): Secondary | ICD-10-CM | POA: Diagnosis not present

## 2022-01-14 DIAGNOSIS — I10 Essential (primary) hypertension: Secondary | ICD-10-CM | POA: Diagnosis not present

## 2022-01-17 DIAGNOSIS — D649 Anemia, unspecified: Secondary | ICD-10-CM | POA: Diagnosis not present

## 2022-01-17 DIAGNOSIS — L84 Corns and callosities: Secondary | ICD-10-CM | POA: Diagnosis not present

## 2022-01-19 DIAGNOSIS — G4733 Obstructive sleep apnea (adult) (pediatric): Secondary | ICD-10-CM | POA: Diagnosis not present

## 2022-01-22 DIAGNOSIS — I491 Atrial premature depolarization: Secondary | ICD-10-CM | POA: Diagnosis not present

## 2022-01-28 DIAGNOSIS — E119 Type 2 diabetes mellitus without complications: Secondary | ICD-10-CM | POA: Diagnosis not present

## 2022-01-28 DIAGNOSIS — I10 Essential (primary) hypertension: Secondary | ICD-10-CM | POA: Diagnosis not present

## 2022-01-28 DIAGNOSIS — R0609 Other forms of dyspnea: Secondary | ICD-10-CM | POA: Diagnosis not present

## 2022-01-28 DIAGNOSIS — I2510 Atherosclerotic heart disease of native coronary artery without angina pectoris: Secondary | ICD-10-CM | POA: Diagnosis not present

## 2022-01-28 DIAGNOSIS — R072 Precordial pain: Secondary | ICD-10-CM | POA: Diagnosis not present

## 2022-01-28 DIAGNOSIS — E7849 Other hyperlipidemia: Secondary | ICD-10-CM | POA: Diagnosis not present

## 2022-02-18 DIAGNOSIS — G4733 Obstructive sleep apnea (adult) (pediatric): Secondary | ICD-10-CM | POA: Diagnosis not present

## 2022-02-20 ENCOUNTER — Telehealth: Payer: Self-pay

## 2022-02-20 DIAGNOSIS — I2510 Atherosclerotic heart disease of native coronary artery without angina pectoris: Secondary | ICD-10-CM | POA: Diagnosis not present

## 2022-02-20 DIAGNOSIS — R072 Precordial pain: Secondary | ICD-10-CM | POA: Diagnosis not present

## 2022-02-20 NOTE — Patient Outreach (Signed)
  Care Coordination   02/20/2022 Name: Jamie Howard MRN: [id number] DOB: [birth year]   Care Coordination Outreach Attempts:  An unsuccessful telephone outreach was attempted today to offer the patient information about available care coordination services as a benefit of their health plan.   Follow Up Plan:  Additional outreach attempts will be made to offer the patient care coordination information and services.   Encounter Outcome:  No Answer  Care Coordination Interventions Activated:  No   Care Coordination Interventions:  No, not indicated    Tomasa Rand, RN, BSN, CEN [facility] [employer] [phone]

## 2022-02-24 ENCOUNTER — Telehealth: Payer: Self-pay

## 2022-02-24 NOTE — Patient Outreach (Signed)
  Care Coordination   02/24/2022 Name: Jamie Howard MRN: [id number] DOB: 21-Jan-1941   Care Coordination Outreach Attempts:  A second unsuccessful outreach was attempted today to offer the patient with information about available care coordination services as a benefit of their health plan.     Follow Up Plan:  Additional outreach attempts will be made to offer the patient care coordination information and services.   Encounter Outcome:  No Answer  Care Coordination Interventions Activated:  No   Care Coordination Interventions:  No, not indicated    .acs

## 2022-02-25 DIAGNOSIS — Z23 Encounter for immunization: Secondary | ICD-10-CM | POA: Diagnosis not present

## 2022-02-25 DIAGNOSIS — I2510 Atherosclerotic heart disease of native coronary artery without angina pectoris: Secondary | ICD-10-CM | POA: Diagnosis not present

## 2022-02-25 DIAGNOSIS — I10 Essential (primary) hypertension: Secondary | ICD-10-CM | POA: Diagnosis not present

## 2022-02-25 DIAGNOSIS — I498 Other specified cardiac arrhythmias: Secondary | ICD-10-CM | POA: Diagnosis not present

## 2022-03-10 ENCOUNTER — Telehealth: Payer: Self-pay

## 2022-03-10 NOTE — Patient Outreach (Signed)
  Care Coordination   03/10/2022 Name: Jamie Howard MRN: [id number] DOB: [birth year]   Care Coordination Outreach Attempts:  A third unsuccessful outreach was attempted today to offer the patient with information about available care coordination services as a benefit of their health plan.   Follow Up Plan:  No further outreach attempts will be made at this time. We have been unable to contact the patient to offer or enroll patient in care coordination services  Encounter Outcome:  No Answer  Care Coordination Interventions Activated:  No   Care Coordination Interventions:  No, not indicated    Tomasa Rand, RN, BSN, CEN [place] Coordinator [phone]

## 2022-03-11 DIAGNOSIS — I2510 Atherosclerotic heart disease of native coronary artery without angina pectoris: Secondary | ICD-10-CM | POA: Diagnosis not present

## 2022-03-11 DIAGNOSIS — I340 Nonrheumatic mitral (valve) insufficiency: Secondary | ICD-10-CM | POA: Diagnosis not present

## 2022-03-21 DIAGNOSIS — G4733 Obstructive sleep apnea (adult) (pediatric): Secondary | ICD-10-CM | POA: Diagnosis not present

## 2022-04-09 DIAGNOSIS — C4492 Squamous cell carcinoma of skin, unspecified: Secondary | ICD-10-CM | POA: Diagnosis not present

## 2022-04-09 DIAGNOSIS — Z8589 Personal history of malignant neoplasm of other organs and systems: Secondary | ICD-10-CM | POA: Diagnosis not present

## 2022-04-18 DIAGNOSIS — L84 Corns and callosities: Secondary | ICD-10-CM | POA: Diagnosis not present

## 2022-04-18 DIAGNOSIS — G4733 Obstructive sleep apnea (adult) (pediatric): Secondary | ICD-10-CM | POA: Diagnosis not present

## 2022-04-18 DIAGNOSIS — N400 Enlarged prostate without lower urinary tract symptoms: Secondary | ICD-10-CM | POA: Diagnosis not present

## 2022-04-18 DIAGNOSIS — I6529 Occlusion and stenosis of unspecified carotid artery: Secondary | ICD-10-CM | POA: Diagnosis not present

## 2022-04-18 DIAGNOSIS — E1159 Type 2 diabetes mellitus with other circulatory complications: Secondary | ICD-10-CM | POA: Diagnosis not present

## 2022-04-18 DIAGNOSIS — I10 Essential (primary) hypertension: Secondary | ICD-10-CM | POA: Diagnosis not present

## 2022-04-18 DIAGNOSIS — I2510 Atherosclerotic heart disease of native coronary artery without angina pectoris: Secondary | ICD-10-CM | POA: Diagnosis not present

## 2022-04-20 DIAGNOSIS — G4733 Obstructive sleep apnea (adult) (pediatric): Secondary | ICD-10-CM | POA: Diagnosis not present

## 2022-05-19 DIAGNOSIS — I2510 Atherosclerotic heart disease of native coronary artery without angina pectoris: Secondary | ICD-10-CM | POA: Diagnosis not present

## 2022-05-19 DIAGNOSIS — E7849 Other hyperlipidemia: Secondary | ICD-10-CM | POA: Diagnosis not present

## 2022-05-19 DIAGNOSIS — I10 Essential (primary) hypertension: Secondary | ICD-10-CM | POA: Diagnosis not present

## 2022-05-19 DIAGNOSIS — E119 Type 2 diabetes mellitus without complications: Secondary | ICD-10-CM | POA: Diagnosis not present

## 2022-05-19 DIAGNOSIS — I480 Paroxysmal atrial fibrillation: Secondary | ICD-10-CM | POA: Diagnosis not present

## 2022-05-19 DIAGNOSIS — Z8679 Personal history of other diseases of the circulatory system: Secondary | ICD-10-CM | POA: Diagnosis not present

## 2022-05-20 DIAGNOSIS — I2510 Atherosclerotic heart disease of native coronary artery without angina pectoris: Secondary | ICD-10-CM | POA: Diagnosis not present

## 2022-05-20 DIAGNOSIS — I4510 Unspecified right bundle-branch block: Secondary | ICD-10-CM | POA: Diagnosis not present

## 2022-05-21 DIAGNOSIS — G4733 Obstructive sleep apnea (adult) (pediatric): Secondary | ICD-10-CM | POA: Diagnosis not present

## 2022-05-22 DIAGNOSIS — I2510 Atherosclerotic heart disease of native coronary artery without angina pectoris: Secondary | ICD-10-CM | POA: Diagnosis not present

## 2022-05-22 DIAGNOSIS — I10 Essential (primary) hypertension: Secondary | ICD-10-CM | POA: Diagnosis not present

## 2022-06-19 DIAGNOSIS — C44722 Squamous cell carcinoma of skin of right lower limb, including hip: Secondary | ICD-10-CM | POA: Diagnosis not present

## 2022-06-19 DIAGNOSIS — D225 Melanocytic nevi of trunk: Secondary | ICD-10-CM | POA: Diagnosis not present

## 2022-06-19 DIAGNOSIS — L820 Inflamed seborrheic keratosis: Secondary | ICD-10-CM | POA: Diagnosis not present

## 2022-06-19 DIAGNOSIS — Z85820 Personal history of malignant melanoma of skin: Secondary | ICD-10-CM | POA: Diagnosis not present

## 2022-06-19 DIAGNOSIS — L578 Other skin changes due to chronic exposure to nonionizing radiation: Secondary | ICD-10-CM | POA: Diagnosis not present

## 2022-06-19 DIAGNOSIS — L570 Actinic keratosis: Secondary | ICD-10-CM | POA: Diagnosis not present

## 2022-06-19 DIAGNOSIS — L814 Other melanin hyperpigmentation: Secondary | ICD-10-CM | POA: Diagnosis not present

## 2022-06-21 DIAGNOSIS — G4733 Obstructive sleep apnea (adult) (pediatric): Secondary | ICD-10-CM | POA: Diagnosis not present

## 2022-06-30 DIAGNOSIS — C44722 Squamous cell carcinoma of skin of right lower limb, including hip: Secondary | ICD-10-CM | POA: Diagnosis not present

## 2022-07-20 DIAGNOSIS — G4733 Obstructive sleep apnea (adult) (pediatric): Secondary | ICD-10-CM | POA: Diagnosis not present

## 2022-07-25 ENCOUNTER — Telehealth: Payer: Self-pay

## 2022-07-25 NOTE — Patient Outreach (Signed)
  Care Coordination   07/25/2022 Name: Jamie Howard MRN: [id number] DOB: [birth year]   Care Coordination Outreach Attempts:  An unsuccessful telephone outreach was attempted today to offer the patient information about available care coordination services as a benefit of their health plan.   Follow Up Plan:  Additional outreach attempts will be made to offer the patient care coordination information and services.   Encounter Outcome:  No Answer   Care Coordination Interventions:  No, not indicated    Tomasa Rand, RN, BSN, [facility] [employer] [phone]

## 2022-07-30 ENCOUNTER — Telehealth: Payer: Self-pay

## 2022-07-30 NOTE — Patient Outreach (Signed)
  Care Coordination   07/30/2022 Name: Jamie Howard MRN: [id number] DOB: [birth year]   Care Coordination Outreach Attempts:  A second unsuccessful outreach was attempted today to offer the patient with information about available care coordination services as a benefit of their health plan.     Follow Up Plan:  Additional outreach attempts will be made to offer the patient care coordination information and services.   Encounter Outcome:  No Answer   Care Coordination Interventions:  No, not indicated    Tomasa Rand, RN, BSN, [facility] [employer] [phone]

## 2022-08-01 ENCOUNTER — Telehealth: Payer: Self-pay

## 2022-08-01 NOTE — Patient Outreach (Signed)
  Care Coordination   08/01/2022 Name: Kodee Desarro MRN: [id number] DOB: January 10, 1941   Care Coordination Outreach Attempts:  A third unsuccessful outreach was attempted today to offer the patient with information about available care coordination services as a benefit of their health plan.   Follow Up Plan:  No further outreach attempts will be made at this time. We have been unable to contact the patient to offer or enroll patient in care coordination services  Encounter Outcome:  No Answer   Care Coordination Interventions:  No, not indicated    Tomasa Rand, RN, BSN, CEN [place] Coordinator [phone]

## 2022-08-12 DIAGNOSIS — E7800 Pure hypercholesterolemia, unspecified: Secondary | ICD-10-CM | POA: Diagnosis not present

## 2022-08-12 DIAGNOSIS — E1159 Type 2 diabetes mellitus with other circulatory complications: Secondary | ICD-10-CM | POA: Diagnosis not present

## 2022-08-12 DIAGNOSIS — I2510 Atherosclerotic heart disease of native coronary artery without angina pectoris: Secondary | ICD-10-CM | POA: Diagnosis not present

## 2022-08-12 DIAGNOSIS — D649 Anemia, unspecified: Secondary | ICD-10-CM | POA: Diagnosis not present

## 2022-08-12 DIAGNOSIS — N182 Chronic kidney disease, stage 2 (mild): Secondary | ICD-10-CM | POA: Diagnosis not present

## 2022-08-12 DIAGNOSIS — M109 Gout, unspecified: Secondary | ICD-10-CM | POA: Diagnosis not present

## 2022-08-12 DIAGNOSIS — R001 Bradycardia, unspecified: Secondary | ICD-10-CM | POA: Diagnosis not present

## 2022-08-12 DIAGNOSIS — I10 Essential (primary) hypertension: Secondary | ICD-10-CM | POA: Diagnosis not present

## 2022-08-20 DIAGNOSIS — G4733 Obstructive sleep apnea (adult) (pediatric): Secondary | ICD-10-CM | POA: Diagnosis not present

## 2022-09-03 DIAGNOSIS — C44722 Squamous cell carcinoma of skin of right lower limb, including hip: Secondary | ICD-10-CM | POA: Diagnosis not present

## 2022-09-03 DIAGNOSIS — L570 Actinic keratosis: Secondary | ICD-10-CM | POA: Diagnosis not present

## 2022-09-17 DIAGNOSIS — Z7984 Long term (current) use of oral hypoglycemic drugs: Secondary | ICD-10-CM | POA: Diagnosis not present

## 2022-09-17 DIAGNOSIS — E119 Type 2 diabetes mellitus without complications: Secondary | ICD-10-CM | POA: Diagnosis not present

## 2022-09-17 DIAGNOSIS — C4492 Squamous cell carcinoma of skin, unspecified: Secondary | ICD-10-CM | POA: Diagnosis not present

## 2022-09-17 DIAGNOSIS — Z87891 Personal history of nicotine dependence: Secondary | ICD-10-CM | POA: Diagnosis not present

## 2022-09-17 DIAGNOSIS — Z8589 Personal history of malignant neoplasm of other organs and systems: Secondary | ICD-10-CM | POA: Diagnosis not present

## 2022-09-17 DIAGNOSIS — F1090 Alcohol use, unspecified, uncomplicated: Secondary | ICD-10-CM | POA: Diagnosis not present

## 2022-09-17 DIAGNOSIS — I2510 Atherosclerotic heart disease of native coronary artery without angina pectoris: Secondary | ICD-10-CM | POA: Diagnosis not present

## 2022-09-17 DIAGNOSIS — I10 Essential (primary) hypertension: Secondary | ICD-10-CM | POA: Diagnosis not present

## 2022-09-17 DIAGNOSIS — Z7982 Long term (current) use of aspirin: Secondary | ICD-10-CM | POA: Diagnosis not present

## 2022-09-18 DIAGNOSIS — I480 Paroxysmal atrial fibrillation: Secondary | ICD-10-CM | POA: Diagnosis not present

## 2022-09-18 DIAGNOSIS — I10 Essential (primary) hypertension: Secondary | ICD-10-CM | POA: Diagnosis not present

## 2022-09-18 DIAGNOSIS — R0609 Other forms of dyspnea: Secondary | ICD-10-CM | POA: Diagnosis not present

## 2022-09-18 DIAGNOSIS — E119 Type 2 diabetes mellitus without complications: Secondary | ICD-10-CM | POA: Diagnosis not present

## 2022-09-18 DIAGNOSIS — E7849 Other hyperlipidemia: Secondary | ICD-10-CM | POA: Diagnosis not present

## 2022-09-18 DIAGNOSIS — I491 Atrial premature depolarization: Secondary | ICD-10-CM | POA: Diagnosis not present

## 2022-09-18 DIAGNOSIS — R001 Bradycardia, unspecified: Secondary | ICD-10-CM | POA: Diagnosis not present

## 2022-09-18 DIAGNOSIS — I2510 Atherosclerotic heart disease of native coronary artery without angina pectoris: Secondary | ICD-10-CM | POA: Diagnosis not present

## 2022-09-18 DIAGNOSIS — Z8679 Personal history of other diseases of the circulatory system: Secondary | ICD-10-CM | POA: Diagnosis not present

## 2022-09-18 DIAGNOSIS — Z87898 Personal history of other specified conditions: Secondary | ICD-10-CM | POA: Diagnosis not present

## 2022-09-18 DIAGNOSIS — R072 Precordial pain: Secondary | ICD-10-CM | POA: Diagnosis not present

## 2022-09-18 DIAGNOSIS — I4510 Unspecified right bundle-branch block: Secondary | ICD-10-CM | POA: Diagnosis not present

## 2022-09-19 DIAGNOSIS — G4733 Obstructive sleep apnea (adult) (pediatric): Secondary | ICD-10-CM | POA: Diagnosis not present

## 2022-09-23 DIAGNOSIS — M48062 Spinal stenosis, lumbar region with neurogenic claudication: Secondary | ICD-10-CM | POA: Diagnosis not present

## 2022-10-15 DIAGNOSIS — R001 Bradycardia, unspecified: Secondary | ICD-10-CM | POA: Diagnosis not present

## 2022-10-24 DIAGNOSIS — M48062 Spinal stenosis, lumbar region with neurogenic claudication: Secondary | ICD-10-CM | POA: Diagnosis not present

## 2022-10-24 DIAGNOSIS — Z6828 Body mass index (BMI) 28.0-28.9, adult: Secondary | ICD-10-CM | POA: Diagnosis not present

## 2022-10-30 DIAGNOSIS — E119 Type 2 diabetes mellitus without complications: Secondary | ICD-10-CM | POA: Diagnosis not present

## 2022-10-30 DIAGNOSIS — I2510 Atherosclerotic heart disease of native coronary artery without angina pectoris: Secondary | ICD-10-CM | POA: Diagnosis not present

## 2022-10-30 DIAGNOSIS — E7849 Other hyperlipidemia: Secondary | ICD-10-CM | POA: Diagnosis not present

## 2022-10-30 DIAGNOSIS — Z955 Presence of coronary angioplasty implant and graft: Secondary | ICD-10-CM | POA: Diagnosis not present

## 2022-10-30 DIAGNOSIS — I471 Supraventricular tachycardia, unspecified: Secondary | ICD-10-CM | POA: Diagnosis not present

## 2022-10-30 DIAGNOSIS — R001 Bradycardia, unspecified: Secondary | ICD-10-CM | POA: Diagnosis not present

## 2022-10-30 DIAGNOSIS — I480 Paroxysmal atrial fibrillation: Secondary | ICD-10-CM | POA: Diagnosis not present

## 2022-10-30 DIAGNOSIS — I10 Essential (primary) hypertension: Secondary | ICD-10-CM | POA: Diagnosis not present

## 2022-11-01 DIAGNOSIS — I491 Atrial premature depolarization: Secondary | ICD-10-CM | POA: Diagnosis not present

## 2022-11-01 DIAGNOSIS — I4719 Other supraventricular tachycardia: Secondary | ICD-10-CM | POA: Diagnosis not present

## 2022-11-05 DIAGNOSIS — S32010A Wedge compression fracture of first lumbar vertebra, initial encounter for closed fracture: Secondary | ICD-10-CM | POA: Diagnosis not present

## 2022-11-05 DIAGNOSIS — M48062 Spinal stenosis, lumbar region with neurogenic claudication: Secondary | ICD-10-CM | POA: Diagnosis not present

## 2022-11-05 DIAGNOSIS — M545 Low back pain, unspecified: Secondary | ICD-10-CM | POA: Diagnosis not present

## 2022-11-05 DIAGNOSIS — M4807 Spinal stenosis, lumbosacral region: Secondary | ICD-10-CM | POA: Diagnosis not present

## 2022-11-05 DIAGNOSIS — M48061 Spinal stenosis, lumbar region without neurogenic claudication: Secondary | ICD-10-CM | POA: Diagnosis not present

## 2022-11-12 DIAGNOSIS — H40053 Ocular hypertension, bilateral: Secondary | ICD-10-CM | POA: Diagnosis not present

## 2022-11-12 DIAGNOSIS — H18413 Arcus senilis, bilateral: Secondary | ICD-10-CM | POA: Diagnosis not present

## 2022-11-12 DIAGNOSIS — H0288B Meibomian gland dysfunction left eye, upper and lower eyelids: Secondary | ICD-10-CM | POA: Diagnosis not present

## 2022-11-12 DIAGNOSIS — H52203 Unspecified astigmatism, bilateral: Secondary | ICD-10-CM | POA: Diagnosis not present

## 2022-11-12 DIAGNOSIS — Z961 Presence of intraocular lens: Secondary | ICD-10-CM | POA: Diagnosis not present

## 2022-11-12 DIAGNOSIS — H0288A Meibomian gland dysfunction right eye, upper and lower eyelids: Secondary | ICD-10-CM | POA: Diagnosis not present

## 2022-11-12 DIAGNOSIS — H0100A Unspecified blepharitis right eye, upper and lower eyelids: Secondary | ICD-10-CM | POA: Diagnosis not present

## 2022-11-12 DIAGNOSIS — H524 Presbyopia: Secondary | ICD-10-CM | POA: Diagnosis not present

## 2022-11-12 DIAGNOSIS — Z794 Long term (current) use of insulin: Secondary | ICD-10-CM | POA: Diagnosis not present

## 2022-11-12 DIAGNOSIS — E119 Type 2 diabetes mellitus without complications: Secondary | ICD-10-CM | POA: Diagnosis not present

## 2022-11-12 DIAGNOSIS — H0100B Unspecified blepharitis left eye, upper and lower eyelids: Secondary | ICD-10-CM | POA: Diagnosis not present

## 2022-11-12 DIAGNOSIS — H0220C Unspecified lagophthalmos, bilateral, upper and lower eyelids: Secondary | ICD-10-CM | POA: Diagnosis not present

## 2022-11-12 DIAGNOSIS — H11153 Pinguecula, bilateral: Secondary | ICD-10-CM | POA: Diagnosis not present

## 2022-11-28 DIAGNOSIS — M48062 Spinal stenosis, lumbar region with neurogenic claudication: Secondary | ICD-10-CM | POA: Diagnosis not present

## 2022-12-05 DIAGNOSIS — M109 Gout, unspecified: Secondary | ICD-10-CM | POA: Diagnosis not present

## 2022-12-05 DIAGNOSIS — G4733 Obstructive sleep apnea (adult) (pediatric): Secondary | ICD-10-CM | POA: Diagnosis not present

## 2022-12-05 DIAGNOSIS — R001 Bradycardia, unspecified: Secondary | ICD-10-CM | POA: Diagnosis not present

## 2022-12-05 DIAGNOSIS — I10 Essential (primary) hypertension: Secondary | ICD-10-CM | POA: Diagnosis not present

## 2022-12-05 DIAGNOSIS — E1159 Type 2 diabetes mellitus with other circulatory complications: Secondary | ICD-10-CM | POA: Diagnosis not present

## 2022-12-05 DIAGNOSIS — Z1331 Encounter for screening for depression: Secondary | ICD-10-CM | POA: Diagnosis not present

## 2022-12-05 DIAGNOSIS — N182 Chronic kidney disease, stage 2 (mild): Secondary | ICD-10-CM | POA: Diagnosis not present

## 2022-12-05 DIAGNOSIS — E7800 Pure hypercholesterolemia, unspecified: Secondary | ICD-10-CM | POA: Diagnosis not present

## 2022-12-05 DIAGNOSIS — I2510 Atherosclerotic heart disease of native coronary artery without angina pectoris: Secondary | ICD-10-CM | POA: Diagnosis not present

## 2022-12-05 DIAGNOSIS — Z9181 History of falling: Secondary | ICD-10-CM | POA: Diagnosis not present

## 2022-12-18 DIAGNOSIS — L820 Inflamed seborrheic keratosis: Secondary | ICD-10-CM | POA: Diagnosis not present

## 2022-12-18 DIAGNOSIS — Z85820 Personal history of malignant melanoma of skin: Secondary | ICD-10-CM | POA: Diagnosis not present

## 2022-12-18 DIAGNOSIS — L570 Actinic keratosis: Secondary | ICD-10-CM | POA: Diagnosis not present

## 2022-12-18 DIAGNOSIS — L814 Other melanin hyperpigmentation: Secondary | ICD-10-CM | POA: Diagnosis not present

## 2022-12-18 DIAGNOSIS — L578 Other skin changes due to chronic exposure to nonionizing radiation: Secondary | ICD-10-CM | POA: Diagnosis not present

## 2022-12-19 DIAGNOSIS — I495 Sick sinus syndrome: Secondary | ICD-10-CM | POA: Diagnosis not present

## 2022-12-19 DIAGNOSIS — I10 Essential (primary) hypertension: Secondary | ICD-10-CM | POA: Diagnosis not present

## 2022-12-19 DIAGNOSIS — Z87891 Personal history of nicotine dependence: Secondary | ICD-10-CM | POA: Diagnosis not present

## 2022-12-19 DIAGNOSIS — I491 Atrial premature depolarization: Secondary | ICD-10-CM | POA: Diagnosis not present

## 2022-12-19 DIAGNOSIS — R008 Other abnormalities of heart beat: Secondary | ICD-10-CM | POA: Diagnosis not present

## 2022-12-19 DIAGNOSIS — Z950 Presence of cardiac pacemaker: Secondary | ICD-10-CM | POA: Diagnosis not present

## 2022-12-19 DIAGNOSIS — I2510 Atherosclerotic heart disease of native coronary artery without angina pectoris: Secondary | ICD-10-CM | POA: Diagnosis not present

## 2022-12-19 DIAGNOSIS — I4719 Other supraventricular tachycardia: Secondary | ICD-10-CM | POA: Diagnosis not present

## 2023-01-06 DIAGNOSIS — G4733 Obstructive sleep apnea (adult) (pediatric): Secondary | ICD-10-CM | POA: Diagnosis not present

## 2023-01-14 DIAGNOSIS — T82120A Displacement of cardiac electrode, initial encounter: Secondary | ICD-10-CM | POA: Diagnosis not present

## 2023-01-14 DIAGNOSIS — Z7984 Long term (current) use of oral hypoglycemic drugs: Secondary | ICD-10-CM | POA: Diagnosis not present

## 2023-01-14 DIAGNOSIS — I2510 Atherosclerotic heart disease of native coronary artery without angina pectoris: Secondary | ICD-10-CM | POA: Diagnosis not present

## 2023-01-14 DIAGNOSIS — Z794 Long term (current) use of insulin: Secondary | ICD-10-CM | POA: Diagnosis not present

## 2023-01-14 DIAGNOSIS — E785 Hyperlipidemia, unspecified: Secondary | ICD-10-CM | POA: Diagnosis not present

## 2023-01-14 DIAGNOSIS — E119 Type 2 diabetes mellitus without complications: Secondary | ICD-10-CM | POA: Diagnosis not present

## 2023-01-14 DIAGNOSIS — I471 Supraventricular tachycardia, unspecified: Secondary | ICD-10-CM | POA: Diagnosis not present

## 2023-01-14 DIAGNOSIS — Z950 Presence of cardiac pacemaker: Secondary | ICD-10-CM | POA: Diagnosis not present

## 2023-01-14 DIAGNOSIS — Z79899 Other long term (current) drug therapy: Secondary | ICD-10-CM | POA: Diagnosis not present

## 2023-01-14 DIAGNOSIS — I517 Cardiomegaly: Secondary | ICD-10-CM | POA: Diagnosis not present

## 2023-01-14 DIAGNOSIS — I495 Sick sinus syndrome: Secondary | ICD-10-CM | POA: Diagnosis not present

## 2023-01-14 DIAGNOSIS — I10 Essential (primary) hypertension: Secondary | ICD-10-CM | POA: Diagnosis not present

## 2023-01-14 DIAGNOSIS — I4719 Other supraventricular tachycardia: Secondary | ICD-10-CM | POA: Diagnosis not present

## 2023-01-14 DIAGNOSIS — I447 Left bundle-branch block, unspecified: Secondary | ICD-10-CM | POA: Diagnosis not present

## 2023-01-14 DIAGNOSIS — R918 Other nonspecific abnormal finding of lung field: Secondary | ICD-10-CM | POA: Diagnosis not present

## 2023-01-14 DIAGNOSIS — Z87891 Personal history of nicotine dependence: Secondary | ICD-10-CM | POA: Diagnosis not present

## 2023-01-15 DIAGNOSIS — I495 Sick sinus syndrome: Secondary | ICD-10-CM | POA: Diagnosis not present

## 2023-01-15 DIAGNOSIS — Z950 Presence of cardiac pacemaker: Secondary | ICD-10-CM | POA: Diagnosis not present

## 2023-01-15 DIAGNOSIS — I771 Stricture of artery: Secondary | ICD-10-CM | POA: Diagnosis not present

## 2023-01-15 DIAGNOSIS — T82120A Displacement of cardiac electrode, initial encounter: Secondary | ICD-10-CM | POA: Diagnosis not present

## 2023-01-22 DIAGNOSIS — Z45018 Encounter for adjustment and management of other part of cardiac pacemaker: Secondary | ICD-10-CM | POA: Diagnosis not present

## 2023-01-22 DIAGNOSIS — I471 Supraventricular tachycardia, unspecified: Secondary | ICD-10-CM | POA: Diagnosis not present

## 2023-01-22 DIAGNOSIS — I442 Atrioventricular block, complete: Secondary | ICD-10-CM | POA: Diagnosis not present

## 2023-03-11 DIAGNOSIS — N182 Chronic kidney disease, stage 2 (mild): Secondary | ICD-10-CM | POA: Diagnosis not present

## 2023-03-11 DIAGNOSIS — E7800 Pure hypercholesterolemia, unspecified: Secondary | ICD-10-CM | POA: Diagnosis not present

## 2023-03-11 DIAGNOSIS — I2510 Atherosclerotic heart disease of native coronary artery without angina pectoris: Secondary | ICD-10-CM | POA: Diagnosis not present

## 2023-03-11 DIAGNOSIS — M109 Gout, unspecified: Secondary | ICD-10-CM | POA: Diagnosis not present

## 2023-03-11 DIAGNOSIS — E1159 Type 2 diabetes mellitus with other circulatory complications: Secondary | ICD-10-CM | POA: Diagnosis not present

## 2023-03-11 DIAGNOSIS — E538 Deficiency of other specified B group vitamins: Secondary | ICD-10-CM | POA: Diagnosis not present

## 2023-03-11 DIAGNOSIS — I10 Essential (primary) hypertension: Secondary | ICD-10-CM | POA: Diagnosis not present

## 2023-03-11 DIAGNOSIS — Z23 Encounter for immunization: Secondary | ICD-10-CM | POA: Diagnosis not present

## 2023-03-11 DIAGNOSIS — R001 Bradycardia, unspecified: Secondary | ICD-10-CM | POA: Diagnosis not present

## 2023-03-11 DIAGNOSIS — G4733 Obstructive sleep apnea (adult) (pediatric): Secondary | ICD-10-CM | POA: Diagnosis not present

## 2023-03-11 DIAGNOSIS — Z139 Encounter for screening, unspecified: Secondary | ICD-10-CM | POA: Diagnosis not present

## 2023-03-12 DIAGNOSIS — Z6829 Body mass index (BMI) 29.0-29.9, adult: Secondary | ICD-10-CM | POA: Diagnosis not present

## 2023-03-12 DIAGNOSIS — M48062 Spinal stenosis, lumbar region with neurogenic claudication: Secondary | ICD-10-CM | POA: Diagnosis not present

## 2023-03-20 DIAGNOSIS — M5416 Radiculopathy, lumbar region: Secondary | ICD-10-CM | POA: Diagnosis not present

## 2023-03-20 DIAGNOSIS — M48062 Spinal stenosis, lumbar region with neurogenic claudication: Secondary | ICD-10-CM | POA: Diagnosis not present

## 2023-03-20 DIAGNOSIS — Z6829 Body mass index (BMI) 29.0-29.9, adult: Secondary | ICD-10-CM | POA: Diagnosis not present

## 2023-04-15 DIAGNOSIS — I442 Atrioventricular block, complete: Secondary | ICD-10-CM | POA: Diagnosis not present

## 2023-04-15 DIAGNOSIS — Z45018 Encounter for adjustment and management of other part of cardiac pacemaker: Secondary | ICD-10-CM | POA: Diagnosis not present

## 2023-04-22 ENCOUNTER — Other Ambulatory Visit: Payer: Self-pay | Admitting: Neurological Surgery

## 2023-04-22 DIAGNOSIS — Z45018 Encounter for adjustment and management of other part of cardiac pacemaker: Secondary | ICD-10-CM | POA: Diagnosis not present

## 2023-04-22 DIAGNOSIS — I442 Atrioventricular block, complete: Secondary | ICD-10-CM | POA: Diagnosis not present

## 2023-04-30 ENCOUNTER — Other Ambulatory Visit: Payer: Self-pay | Admitting: Neurological Surgery

## 2023-05-15 ENCOUNTER — Encounter (HOSPITAL_COMMUNITY): Payer: Self-pay

## 2023-05-15 NOTE — Pre-Procedure Instructions (Signed)
 Surgical Instructions   Your procedure is scheduled on May 22, 2023. Report to [facility] Main Entrance A at 11:00 A.M., then check in with the Admitting office. Any questions or running late day of surgery: call [phone]  Questions prior to your surgery date: call [phone], Monday-Friday, 8am-4pm. If you experience any cold or flu symptoms such as cough, fever, chills, shortness of breath, etc. between now and your scheduled surgery, please notify us  at the above number.     Remember:  Do not eat or drink after midnight the night before your surgery    Take these medicines the morning of surgery with A SIP OF WATER: allopurinol (ZYLOPRIM)  atorvastatin (LIPITOR)    May take these medicines IF NEEDED: colchicine     Follow your surgeon's instructions on when to stop Aspirin .  If no instructions were given by your surgeon then you will need to call the office to get those instructions.     One week prior to surgery, STOP taking any Aleve, Naproxen, Ibuprofen , Motrin , Advil , Goody's, BC's, all herbal medications, fish oil, and non-prescription vitamins.   WHAT DO I DO ABOUT MY DIABETES MEDICATION?   Do not take metFORMIN (GLUCOPHAGE-XR) the morning of surgery.  THE NIGHT BEFORE SURGERY, take 10 units of insulin  glargine (TOUJEO SOLOSTAR).       HOW TO MANAGE YOUR DIABETES BEFORE AND AFTER SURGERY  Why is it important to control my blood sugar before and after surgery? Improving blood sugar levels before and after surgery helps healing and can limit problems. A way of improving blood sugar control is eating a healthy diet by:  Eating less sugar and carbohydrates  Increasing activity/exercise  Talking with your doctor about reaching your blood sugar goals High blood sugars (greater than 180 mg/dL) can raise your risk of infections and slow your recovery, so you will need to focus on controlling your diabetes during the weeks before surgery. Make sure that the  doctor who takes care of your diabetes knows about your planned surgery including the date and location.  How do I manage my blood sugar before surgery? Check your blood sugar at least 4 times a day, starting 2 days before surgery, to make sure that the level is not too high or low.  Check your blood sugar the morning of your surgery when you wake up and every 2 hours until you get to the Short Stay unit.  If your blood sugar is less than 70 mg/dL, you will need to treat for low blood sugar: Do not take insulin . Treat a low blood sugar (less than 70 mg/dL) with  cup of clear juice (cranberry or apple), 4 glucose tablets, OR glucose gel. Recheck blood sugar in 15 minutes after treatment (to make sure it is greater than 70 mg/dL). If your blood sugar is not greater than 70 mg/dL on recheck, call [phone] for further instructions. Report your blood sugar to the short stay nurse when you get to Short Stay.  If you are admitted to the hospital after surgery: Your blood sugar will be checked by the staff and you will probably be given insulin  after surgery (instead of oral diabetes medicines) to make sure you have good blood sugar levels. The goal for blood sugar control after surgery is 80-180 mg/dL.                      Do NOT Smoke (Tobacco/Vaping) for 24 hours prior to your procedure.  If you use  a CPAP at night, you may bring your mask/headgear for your overnight stay.   You will be asked to remove any contacts, glasses, piercing's, hearing aid's, dentures/partials prior to surgery. Please bring cases for these items if needed.    Patients discharged the day of surgery will not be allowed to drive home, and someone needs to stay with them for 24 hours.  SURGICAL WAITING ROOM VISITATION Patients may have no more than 2 support people in the waiting area - these visitors may rotate.   Pre-op nurse will coordinate an appropriate time for 1 ADULT support person, who may not rotate, to  accompany patient in pre-op.  Children under the age of 24 must have an adult with them who is not the patient and must remain in the main waiting area with an adult.  If the patient needs to stay at the hospital during part of their recovery, the visitor guidelines for inpatient rooms apply.  Please refer to the [facility] website for the visitor guidelines for any additional information.   If you received a COVID test during your pre-op visit  it is requested that you wear a mask when out in public, stay away from anyone that may not be feeling well and notify your surgeon if you develop symptoms. If you have been in contact with anyone that has tested positive in the last 10 days please notify you surgeon.      Pre-operative 5 CHG Bathing Instructions   You can play a key role in reducing the risk of infection after surgery. Your skin needs to be as free of germs as possible. You can reduce the number of germs on your skin by washing with CHG (chlorhexidine  gluconate) soap before surgery. CHG is an antiseptic soap that kills germs and continues to kill germs even after washing.   DO NOT use if you have an allergy to chlorhexidine /CHG or antibacterial soaps. If your skin becomes reddened or irritated, stop using the CHG and notify one of our RNs at [phone].   Please shower with the CHG soap starting 4 days before surgery using the following schedule:     Please keep in mind the following:  DO NOT shave, including legs and underarms, starting the day of your first shower.   You may shave your face at any point before/day of surgery.  Place clean sheets on your bed the day you start using CHG soap. Use a clean washcloth (not used since being washed) for each shower. DO NOT sleep with pets once you start using the CHG.   CHG Shower Instructions:  Wash your face and private area with normal soap. If you choose to wash your hair, wash first with your normal shampoo.  After you use  shampoo/soap, rinse your hair and body thoroughly to remove shampoo/soap residue.  Turn the water OFF and apply about 3 tablespoons (45 ml) of CHG soap to a CLEAN washcloth.  Apply CHG soap ONLY FROM YOUR NECK DOWN TO YOUR TOES (washing for 3-5 minutes)  DO NOT use CHG soap on face, private areas, open wounds, or sores.  Pay special attention to the area where your surgery is being performed.  If you are having back surgery, having someone wash your back for you may be helpful. Wait 2 minutes after CHG soap is applied, then you may rinse off the CHG soap.  Pat dry with a clean towel  Put on clean clothes/pajamas   If you choose to wear lotion,  please use ONLY the CHG-compatible lotions on the back of this paper.   Additional instructions for the day of surgery: DO NOT APPLY any lotions, deodorants, cologne, or perfumes.   Do not bring valuables to the hospital. [facility] is not responsible for any belongings/valuables. Do not wear nail polish, gel polish, artificial nails, or any other type of covering on natural nails (fingers and toes) Do not wear jewelry or makeup Put on clean/comfortable clothes.  Please brush your teeth.  Ask your nurse before applying any prescription medications to the skin.     CHG Compatible Lotions   Aveeno Moisturizing lotion  Cetaphil Moisturizing Cream  Cetaphil Moisturizing Lotion  Clairol Herbal Essence Moisturizing Lotion, Dry Skin  Clairol Herbal Essence Moisturizing Lotion, Extra Dry Skin  Clairol Herbal Essence Moisturizing Lotion, Normal Skin  Curel Age Defying Therapeutic Moisturizing Lotion with Alpha Hydroxy  Curel Extreme Care Body Lotion  Curel Soothing Hands Moisturizing Hand Lotion  Curel Therapeutic Moisturizing Cream, Fragrance-Free  Curel Therapeutic Moisturizing Lotion, Fragrance-Free  Curel Therapeutic Moisturizing Lotion, Original Formula  Eucerin Daily Replenishing Lotion  Eucerin Dry Skin Therapy Plus Alpha Hydroxy Crme   Eucerin Dry Skin Therapy Plus Alpha Hydroxy Lotion  Eucerin Original Crme  Eucerin Original Lotion  Eucerin Plus Crme Eucerin Plus Lotion  Eucerin TriLipid Replenishing Lotion  Keri Anti-Bacterial Hand Lotion  Keri Deep Conditioning Original Lotion Dry Skin Formula Softly Scented  Keri Deep Conditioning Original Lotion, Fragrance Free Sensitive Skin Formula  Keri Lotion Fast Absorbing Fragrance Free Sensitive Skin Formula  Keri Lotion Fast Absorbing Softly Scented Dry Skin Formula  Keri Original Lotion  Keri Skin Renewal Lotion Keri Silky Smooth Lotion  Keri Silky Smooth Sensitive Skin Lotion  Nivea Body Creamy Conditioning Oil  Nivea Body Extra Enriched Lotion  Nivea Body Original Lotion  Nivea Body Sheer Moisturizing Lotion Nivea Crme  Nivea Skin Firming Lotion  NutraDerm 30 Skin Lotion  NutraDerm Skin Lotion  NutraDerm Therapeutic Skin Cream  NutraDerm Therapeutic Skin Lotion  ProShield Protective Hand Cream  Provon moisturizing lotion  Please read over the following fact sheets that you were given.

## 2023-05-18 ENCOUNTER — Encounter (HOSPITAL_COMMUNITY): Payer: Self-pay

## 2023-05-18 ENCOUNTER — Other Ambulatory Visit: Payer: Self-pay | Admitting: Neurological Surgery

## 2023-05-18 ENCOUNTER — Encounter (HOSPITAL_COMMUNITY)
Admission: RE | Admit: 2023-05-18 | Discharge: 2023-05-18 | Disposition: A | Discharge status: Home / Self Care | Payer: PPO | Source: Ambulatory Visit | Attending: Neurological Surgery | Admitting: Neurological Surgery

## 2023-05-18 ENCOUNTER — Other Ambulatory Visit: Payer: Self-pay

## 2023-05-18 VITALS — BP 136/49 | HR 66 | Temp 98.4°F | Resp 18 | Ht 70.5 in | Wt 206.4 lb

## 2023-05-18 DIAGNOSIS — M48062 Spinal stenosis, lumbar region with neurogenic claudication: Secondary | ICD-10-CM | POA: Diagnosis not present

## 2023-05-18 DIAGNOSIS — Z794 Long term (current) use of insulin: Secondary | ICD-10-CM | POA: Insufficient documentation

## 2023-05-18 DIAGNOSIS — Z87891 Personal history of nicotine dependence: Secondary | ICD-10-CM | POA: Insufficient documentation

## 2023-05-18 DIAGNOSIS — I480 Paroxysmal atrial fibrillation: Secondary | ICD-10-CM | POA: Insufficient documentation

## 2023-05-18 DIAGNOSIS — Z01812 Encounter for preprocedural laboratory examination: Secondary | ICD-10-CM | POA: Diagnosis not present

## 2023-05-18 DIAGNOSIS — E785 Hyperlipidemia, unspecified: Secondary | ICD-10-CM | POA: Insufficient documentation

## 2023-05-18 DIAGNOSIS — E119 Type 2 diabetes mellitus without complications: Secondary | ICD-10-CM | POA: Insufficient documentation

## 2023-05-18 DIAGNOSIS — Z7984 Long term (current) use of oral hypoglycemic drugs: Secondary | ICD-10-CM | POA: Diagnosis not present

## 2023-05-18 DIAGNOSIS — Z01818 Encounter for other preprocedural examination: Secondary | ICD-10-CM

## 2023-05-18 DIAGNOSIS — I2510 Atherosclerotic heart disease of native coronary artery without angina pectoris: Secondary | ICD-10-CM | POA: Diagnosis not present

## 2023-05-18 DIAGNOSIS — I10 Essential (primary) hypertension: Secondary | ICD-10-CM | POA: Insufficient documentation

## 2023-05-18 HISTORY — DX: Presence of cardiac pacemaker: Z95.0

## 2023-05-18 HISTORY — DX: Cardiac arrhythmia, unspecified: I49.9

## 2023-05-18 HISTORY — DX: Malignant (primary) neoplasm, unspecified: C80.1

## 2023-05-18 LAB — BASIC METABOLIC PANEL
Anion gap: 10 (ref 5–15)
BUN: 26 mg/dL — ABNORMAL HIGH (ref 8–23)
CO2: 26 mmol/L (ref 22–32)
Calcium: 9.1 mg/dL (ref 8.9–10.3)
Chloride: 99 mmol/L (ref 98–111)
Creatinine, Ser: 1.17 mg/dL (ref 0.61–1.24)
GFR, Estimated: >60 mL/min (ref >60)
Glucose, Bld: 239 mg/dL — ABNORMAL HIGH (ref 70–99)
Potassium: 4.3 mmol/L (ref 3.5–5.1)
Sodium: 135 mmol/L (ref 135–145)

## 2023-05-18 LAB — CBC
HCT: 36.2 % — ABNORMAL LOW (ref 39.0–52.0)
Hemoglobin: 11.8 g/dL — ABNORMAL LOW (ref 13.0–17.0)
MCH: 29.9 pg (ref 26.0–34.0)
MCHC: 32.6 g/dL (ref 30.0–36.0)
MCV: 91.9 fL (ref 80.0–100.0)
Platelets: 190 10*3/uL (ref 150–400)
RBC: 3.94 MIL/uL — ABNORMAL LOW (ref 4.22–5.81)
RDW: 13.3 % (ref 11.5–15.5)
WBC: 6.2 10*3/uL (ref 4.0–10.5)
nRBC: 0 % (ref 0.0–0.2)

## 2023-05-18 LAB — GLUCOSE, CAPILLARY: Glucose-Capillary: 232 mg/dL — ABNORMAL HIGH (ref 70–99)

## 2023-05-18 LAB — SURGICAL PCR SCREEN
MRSA, PCR: NEGATIVE
Staphylococcus aureus: NEGATIVE

## 2023-05-18 NOTE — Progress Notes (Signed)
 [employer] rep emailed 1/3, device orders request  and EKG tracing request faxed as well on 1/3.

## 2023-05-18 NOTE — Progress Notes (Signed)
 PCP - Rankin Dike, PA-C Cardiologist - Morene Phoenix, PA  PPM/ICD - denies   Chest x-ray - 01/15/23- CE EKG - 01/22/23- CE- tracing requested Stress Test - 02/20/22- CE ECHO - 03/11/22- CE Cardiac Cath - 2002- 1 stent placed   Sleep Study - OSA+ CPAP - nightly, unsure of pressure settings  Fasting Blood Sugar - 100-120 Checks Blood Sugar 1-2 times per week  Last dose of GLP1 agonist-  n/a   Blood Thinner Instructions: n/a Aspirin  Instructions: pt took last dose 1/3, pt knows not to take any further doses until after surgery  ERAS Protcol - no, NPO   COVID TEST- n/a   Anesthesia review: yes, cardiac hx. Awaiting device orders and EKG tracing (refaxed 1/6 to correct fax number)  Patient denies shortness of breath, fever, cough and chest pain at PAT appointment   All instructions explained to the patient, with a verbal understanding of the material. Patient agrees to go over the instructions while at home for a better understanding.  The opportunity to ask questions was provided.

## 2023-05-19 LAB — HEMOGLOBIN A1C
Hgb A1c MFr Bld: 7.6 % — ABNORMAL HIGH (ref 4.8–5.6)
Mean Plasma Glucose: 171 mg/dL

## 2023-05-19 NOTE — Anesthesia Preprocedure Evaluation (Addendum)
 Anesthesia Evaluation  Patient identified by MRN, date of birth, ID band Patient awake    Reviewed: Allergy & Precautions, H&P , NPO status , Patient's Chart, lab work & pertinent test results  Airway Mallampati: III  TM Distance: >3 FB Neck ROM: Full    Dental no notable dental hx. (+) Teeth Intact, Dental Advisory Given   Pulmonary former smoker   Pulmonary exam normal breath sounds clear to auscultation       Cardiovascular hypertension, Pt. on medications + CAD and + Cardiac Stents  + dysrhythmias + pacemaker  Rhythm:Regular Rate:Normal  Echo 03/11/22 ([facility]): SUMMARY  Normal LV size, wall thickness, wall motion and systolic function with  ejection fraction 60-65%  The right ventricle is normal in size and function.  The atria are normal in size.  There is trace aortic regurgitation.  There is mild mitral regurgitation.  The tricuspid valve is normal in structure and function.  Trace to mild pulmonic valvular regurgitation.  There is no pericardial effusion.  No prior study available for comparison.      Nuclear stress test 02/20/22 ([facility]): IMPRESSION:  Normal myocardial uptake both with stress and at rest.  There is no  evidence for inducible ischemia.  Normal left ventricular function with ejection fraction calculated at 53 %       Neuro/Psych negative neurological ROS  negative psych ROS   GI/Hepatic negative GI ROS, Neg liver ROS,,,  Endo/Other  diabetes, Type 2, Oral Hypoglycemic Agents, Insulin  Dependent    Renal/GU negative Renal ROS  negative genitourinary   Musculoskeletal   Abdominal   Peds  Hematology negative hematology ROS (+)   Anesthesia Other Findings   Reproductive/Obstetrics negative OB ROS                             Anesthesia Physical Anesthesia Plan  ASA: 3  Anesthesia Plan: General   Post-op Pain Management: Tylenol  PO (pre-op)*    Induction: Intravenous  PONV Risk Score and Plan: 3 and Ondansetron  and Treatment may vary due to age or medical condition  Airway Management Planned: Oral ETT  Additional Equipment:   Intra-op Plan:   Post-operative Plan: Extubation in OR  Informed Consent: I have reviewed the patients History and Physical, chart, labs and discussed the procedure including the risks, benefits and alternatives for the proposed anesthesia with the patient or authorized representative who has indicated his/her understanding and acceptance.     Dental advisory given  Plan Discussed with: CRNA  Anesthesia Plan Comments: (See PAT note written 05/19/2023 by Allison Zelenak, PA-C.  Post PPM CXR on 01/15/23 ([facility]) showed Diffuse tracheal narrowing at the thoracic inlet. 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 This could be confirmed with thyroid  ultrasound. He denied known personal or family history of thyromegaly. Denied compressive symptoms. TSH normal in June 2024. Normal thyroid  gland on 2020 CT.    )        Anesthesia Quick Evaluation

## 2023-05-19 NOTE — Progress Notes (Addendum)
 Anesthesia Chart Review:  Case: [id number] Date/Time: 05/22/23 1253   Procedure: open lumbar laminectomy, medical facetectomies, L45   Anesthesia type: General   Pre-op diagnosis: spinal stenosis, lumbar region with neurogenic claudication   [facility]: [facility] ROOM 19 / [facility]   Surgeons: Dawley, Lani BROCKS, DO       DISCUSSION: Patient is an 83 year old male scheduled for the above procedure.  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   Reported last ASA 05/14/22. A1c 7.6%, up from 6.6%. He reported eating sweets more liberally over the holidays. Fasting CBGs are primarily < 120 for him.   He is followed by [facility] in [facility]. Primary cardiologist is Dr. Launie. EP cardiologist is Dr. Meldon. Remote history of PCI ~ 2002. He had PAF in setting of pericarditis ~ 2016 and is no longer on anticoagulation. Nuclear stress test on 02/20/22 showed no inducible ischemia. TTE on 03/11/22 showed an LVEF of 60 to 65%, normal wall motion, normal RV size and function, mild MR. Zio patch 09/25/22 showed 506 episodes of SVT with the longest lasting 1 minute 16 seconds. Non-sustained atrial tachycardia and SB with average HR of 51 also noted. He was experiencing symptoms of dyspnea, fatigue, and lightheadedness, so dual chamber PPM with left bundle pacing recommended then could provide medication to suppress atrial tachycardia. Medtronic PPM placed on 01/14/23 with atrial lead revision on 01/15/23.   Last cardiology visit was on  01/22/23 with Odis Phoenix for PPM insertion follow-up. Amlodipine decreased to 5 mg due to some LE edema and soft BP readings (92/62), and was advised to monitor BP and follow-up with PCP if still low. No significant symptoms of palpitations, pre-syncope, syncope or weakness. EP follow-up in 3-4 months with remote quarterly device interrogation. Continue primary cardiology follow-up (scheduled for 07/08/23). BP 136/49 at PAT. He reported DBP still tend to run on the lower side, but is no longer having dizziness. He denied any new CV symptoms.   Last remote PPM interrogation noted was from 04/16/23: MDT- PPM remote transmission reviewed. Normal device function. AP: 52.5%,  VP: 97.9%. 1 VT-NS episode 8 beats @ 190bpm Lead trends, histograms, and other diagnostic trends stable. Estimated 12.0 years (16-Apr-2023) remaining until ERI. Next remote in 91 days.   EP Preoperative PPM recommendations provided per Odis Phoenix, PA-C: He noted patient is pacer dependent and had normal device function at 04/16/23 remote check. He recommended magnet paced over device during procedure, no post-op interrogation needed.   Of note post-procedure CXR findings on 01/15/23 included:  Diffuse tracheal narrowing at the thoracic inlet. 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 This could be confirmed with thyroid  ultrasound. I called Mr. Mcglocklin. He was unaware of CXR report findings. He has never been told he has an enlarged thyroid  and reports neck examined at least yearly by his PCP. His TSH was normal in June 2024. He denied dysphagia, neck fullness, neck enlargement, high pitched or barky coughing when sick, orthopnea, SOB. No known family history of goiter. 2020 CT of neck showed a normal thyroid  gland.  2022 carotid US  did not mention any incidental thyroid  findings. I did encourage him to follow-up with his PCP or cardiologist (who ordered the CXR) for additional recommendations or imaging orders.   Discussed case with  anesthesiologist Darlyn Rush, MD. Mr. Erhard, does not report any compressive thyroid  symptoms. Further airway exam on the day of surgery. Since he is pacer dependant and will be in the prone position then the Medtronic rep may need to reprogram PPM before and after surgery, but would defer to assigned anesthesia team.   VS: BP (!) 136/49   Pulse 66   Temp 36.9 C (Oral)   Resp 18   Ht 5' 10.5 (1.791 m)   Wt 93.6 kg   SpO2 100%   BMI 29.20 kg/m    PROVIDERS: Montey Lot, PA-C is PCP in [place], . He also receives care through the [facility] (Dr. Evelyln Cintron-Lorenzo in [place]).  Launie HILARIO Dames, MD is cardiologist ([facility]). Last visit 10/30/22 with Parthenia Mems, DNP, APRN. No chest pain. B-blocker discontinued due to Next visit scheduled 07/08/23.  Meldon Lash, MD is EP cardiologist ([facility])   LABS: Preoperative labs noted. AST 22, ALT 20 11/27/22 ([facility]). TSH 2.216 on 10/30/22. He in on a Mg supplement for Mg 1.2 on 09/18/22, but normal at 2.4 on 10/30/22.  (all labs ordered are listed, but only abnormal results are displayed)  Labs Reviewed  GLUCOSE, CAPILLARY - Abnormal; Notable for the following components:      Result Value   Glucose-Capillary 232 (*)    All other components within normal limits  HEMOGLOBIN A1C - Abnormal; Notable for the following components:   Hgb A1c MFr Bld 7.6 (*)    All other components within normal limits  CBC - Abnormal; Notable for the following components:   RBC 3.94 (*)    Hemoglobin 11.8 (*)    HCT 36.2 (*)    All other components within normal limits  BASIC METABOLIC PANEL - Abnormal; Notable for the following components:   Glucose, Bld 239 (*)    BUN 26 (*)    All other components within normal limits  SURGICAL PCR SCREEN     IMAGES: CXR 01/15/23 ([facility]): IMPRESSION: 1. Repositioned atrial lead of the left subclavian dual lead  pacemaker with its tip in the mid to inferior right atrium.  2. No pneumothorax.   3. Diffuse tracheal narrowing at the thoracic inlet. 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 This could be confirmed with thyroid  ultrasound.  - [facility] 06/30/18 CT soft tissue of the neck showed normal thyroid  gland.  CT Chest 09/17/22 [facility]): FINDINGS:  - LUNGS, AIRWAYS, AND PLEURA: Lungs are clear. Central airways are patent. No pleural effusion or pneumothorax.  - MEDIASTINUM AND LYMPH NODES: No enlarged intrathoracic, axillary, or supraclavicular lymph nodes. Small sliding type hiatal hernia.  - HEART AND VASCULATURE: Cardiac chambers are normal in size. Extensive coronary artery calcifications. No pericardial effusion. Normal caliber aorta with moderate calcifications. Main pulmonary artery is normal in size.  - CHEST WALL AND BONES: No chest wall abnormalities. Moderate degenerative changes of the thoracic spine.  - UPPER ABDOMEN: Focus of arterial enhancement in segment 3 of the liver, unchanged on priors likely a small hemangioma.  IMPRESSION:  No thoracic metastatic disease.    EKG: 01/22/23 ([facility]): Copy on shadow chart: AV dual-paced rhythm  When compared with ECG of 19-Dec-2022 11 01,  Electronic pacemaker detected has replaced Sinus rhythm  Confirmed by  Rojelio Dunnings  [id number]  on 01-22-2023 2 45 45 PM    CV: Holter monitor 09/25/22 ([facility]): FINAL PROVIDER INTERPRETATION  Agree with Findings Dois, Alm, MD) Frequent PACs  506 episodes of atrial tachycardia with average rate 108 bpm lasting 4-145 beats   PRELIMINARY FINDINGS  Patient had a min HR of 32 bpm, max HR of 156 bpm, and avg HR of 51 bpm. Predominant underlying rhythm was Sinus Rhythm. Slight P wave morphology changes were noted. 506 Supraventricular Tachycardia runs occurred, the run with the fastest interval lasting 4 beats with a max rate of 156 bpm, the longest lasting 1 min 16 secs with an avg rate of 115 bpm. Some episodes of Supraventricular Tachycardia may be possible Atrial Tachycardia with  variable block. Idioventricular Rhythm was present. Supraventricular Tachycardia was detected within +-- 45 seconds of symptomatic patient event s . Isolated SVEs were frequent  11.5%, [id number] , SVE Couplets were occasional  1.3%, 6861 , and SVE Triplets were rare  <1.0%, 2418 . Isolated VEs were rare  <1.0%, 1186 , VE Couplets were rare  <1.0%, 31 , and VE Triplets were rare  <1.0%, 6 .    Echo 03/11/22 ([facility]): SUMMARY  Normal LV size, wall thickness, wall motion and systolic function with  ejection fraction 60-65%  The right ventricle is normal in size and function.  The atria are normal in size.  There is trace aortic regurgitation.  There is mild mitral regurgitation.  The tricuspid valve is normal in structure and function.  Trace to mild pulmonic valvular regurgitation.  There is no pericardial effusion.  No prior study available for comparison.    Nuclear stress test 02/20/22 ([facility]): IMPRESSION:  Normal myocardial uptake both with stress and at rest.  There is no  evidence for inducible ischemia.  Normal left ventricular function with ejection fraction calculated at 53 %    US  Carotid 08/07/20: IMPRESSION: Color duplex indicates moderate heterogeneous and calcified plaque, with no hemodynamically significant stenosis by duplex criteria in the extracranial cerebrovascular circulation.   Past Medical History:  Diagnosis Date   CAD (coronary artery disease)    a. s/p remote stent ~2002   Cancer (HCC)    skin cancer removed from right cheek area (under eye)   Diabetes mellitus without complication (HCC)    Dysrhythmia    Complete Heart Block - PPM placed   HLD (hyperlipidemia)    Hypertension    Presence of permanent cardiac pacemaker    Medtronic PPM    Past Surgical History:  Procedure Laterality Date   BACK SURGERY     ruptured disk 1970s-1980s   OTHER SURGICAL HISTORY     stent   TONSILLECTOMY     removed as a teenager    MEDICATIONS:   allopurinol (ZYLOPRIM) 100 MG tablet   amLODipine (NORVASC) 5 MG tablet   aspirin  EC 81 MG tablet   atorvastatin (LIPITOR) 40 MG tablet   Coenzyme Q10 (COQ10 PO)   colchicine  0.6 MG tablet   cyanocobalamin  (VITAMIN B12) 1000 MCG tablet   doxazosin (CARDURA) 8 MG tablet   ferrous sulfate 325 (65 FE) MG tablet   insulin  glargine, 1 Unit Dial, (TOUJEO SOLOSTAR) 300 UNIT/ML Solostar Pen   latanoprost (XALATAN) 0.005 % ophthalmic solution   losartan-hydrochlorothiazide (HYZAAR) 100-25 MG per tablet   magnesium oxide (MAG-OX) 400 (240 Mg) MG tablet   metFORMIN (GLUCOPHAGE-XR) 500 MG 24 hr tablet   Multiple Vitamin (MULTIVITAMIN) tablet   vitamin C (ASCORBIC  ACID) 250 MG tablet   No current facility-administered medications for this encounter.    Isaiah Ruder, PA-C Surgical Short Stay/Anesthesiology [facility] Phone [phone] [facility] Phone [phone] 05/19/2023 6:24 PM

## 2023-05-21 DIAGNOSIS — R001 Bradycardia, unspecified: Secondary | ICD-10-CM | POA: Diagnosis not present

## 2023-05-21 DIAGNOSIS — I2510 Atherosclerotic heart disease of native coronary artery without angina pectoris: Secondary | ICD-10-CM | POA: Diagnosis not present

## 2023-05-21 DIAGNOSIS — I480 Paroxysmal atrial fibrillation: Secondary | ICD-10-CM | POA: Diagnosis not present

## 2023-05-21 DIAGNOSIS — I442 Atrioventricular block, complete: Secondary | ICD-10-CM | POA: Diagnosis not present

## 2023-05-21 DIAGNOSIS — Z45018 Encounter for adjustment and management of other part of cardiac pacemaker: Secondary | ICD-10-CM | POA: Diagnosis not present

## 2023-05-21 DIAGNOSIS — I10 Essential (primary) hypertension: Secondary | ICD-10-CM | POA: Diagnosis not present

## 2023-05-22 ENCOUNTER — Other Ambulatory Visit: Payer: Self-pay

## 2023-05-22 ENCOUNTER — Ambulatory Visit (HOSPITAL_COMMUNITY): Payer: PPO

## 2023-05-22 ENCOUNTER — Encounter (HOSPITAL_COMMUNITY)
Admission: RE | Disposition: A | Discharge status: Home / Self Care | Payer: Self-pay | Source: Ambulatory Visit | Attending: Neurological Surgery

## 2023-05-22 ENCOUNTER — Ambulatory Visit (HOSPITAL_COMMUNITY)
Admission: RE | Admit: 2023-05-22 | Discharge: 2023-05-22 | Disposition: A | Discharge status: Home / Self Care | Payer: PPO | Source: Ambulatory Visit | Attending: Neurological Surgery | Admitting: Neurological Surgery

## 2023-05-22 ENCOUNTER — Ambulatory Visit (HOSPITAL_COMMUNITY): Payer: PPO | Admitting: Vascular Surgery

## 2023-05-22 ENCOUNTER — Ambulatory Visit (HOSPITAL_BASED_OUTPATIENT_CLINIC_OR_DEPARTMENT_OTHER): Payer: PPO | Admitting: Certified Registered Nurse Anesthetist

## 2023-05-22 ENCOUNTER — Encounter (HOSPITAL_COMMUNITY): Payer: Self-pay | Admitting: Neurological Surgery

## 2023-05-22 DIAGNOSIS — E119 Type 2 diabetes mellitus without complications: Secondary | ICD-10-CM

## 2023-05-22 DIAGNOSIS — Z7984 Long term (current) use of oral hypoglycemic drugs: Secondary | ICD-10-CM | POA: Insufficient documentation

## 2023-05-22 DIAGNOSIS — Z950 Presence of cardiac pacemaker: Secondary | ICD-10-CM | POA: Diagnosis not present

## 2023-05-22 DIAGNOSIS — I10 Essential (primary) hypertension: Secondary | ICD-10-CM

## 2023-05-22 DIAGNOSIS — Z85828 Personal history of other malignant neoplasm of skin: Secondary | ICD-10-CM | POA: Diagnosis not present

## 2023-05-22 DIAGNOSIS — M48062 Spinal stenosis, lumbar region with neurogenic claudication: Secondary | ICD-10-CM | POA: Diagnosis not present

## 2023-05-22 DIAGNOSIS — Z87891 Personal history of nicotine dependence: Secondary | ICD-10-CM | POA: Diagnosis not present

## 2023-05-22 DIAGNOSIS — I2510 Atherosclerotic heart disease of native coronary artery without angina pectoris: Secondary | ICD-10-CM

## 2023-05-22 DIAGNOSIS — Z794 Long term (current) use of insulin: Secondary | ICD-10-CM | POA: Diagnosis not present

## 2023-05-22 DIAGNOSIS — Z0189 Encounter for other specified special examinations: Secondary | ICD-10-CM | POA: Diagnosis not present

## 2023-05-22 HISTORY — PX: LUMBAR LAMINECTOMY/DECOMPRESSION MICRODISCECTOMY: SHX5026

## 2023-05-22 LAB — GLUCOSE, CAPILLARY
Glucose-Capillary: 139 mg/dL — ABNORMAL HIGH (ref 70–99)
Glucose-Capillary: 109 mg/dL — ABNORMAL HIGH (ref 70–99)
Glucose-Capillary: 130 mg/dL — ABNORMAL HIGH (ref 70–99)

## 2023-05-22 SURGERY — LUMBAR LAMINECTOMY/DECOMPRESSION MICRODISCECTOMY 1 LEVEL
Anesthesia: General | Site: Spine Lumbar

## 2023-05-22 MED ORDER — ONDANSETRON HCL 4 MG/2ML IJ SOLN
INTRAMUSCULAR | Status: DC | PRN
  Administered 2023-05-22: 4 mg via INTRAVENOUS

## 2023-05-22 MED ORDER — BUPIVACAINE LIPOSOME 1.3 % IJ SUSP
INTRAMUSCULAR | Status: DC | PRN
  Administered 2023-05-22: 20 mL

## 2023-05-22 MED ORDER — CHLORHEXIDINE GLUCONATE CLOTH 2 % EX PADS: 6.0000 | MEDICATED_PAD | Freq: Once | CUTANEOUS | Status: DC

## 2023-05-22 MED ORDER — METHYLPREDNISOLONE ACETATE 80 MG/ML IJ SUSP
INTRAMUSCULAR | Status: DC | PRN
  Administered 2023-05-22: 40 mg

## 2023-05-22 MED ORDER — BUPIVACAINE HCL (PF) 0.5 % IJ SOLN
INTRAMUSCULAR | Status: DC | PRN
  Administered 2023-05-22: 5 mL

## 2023-05-22 MED ORDER — THROMBIN 20000 UNITS EX SOLR
CUTANEOUS | Status: AC
  Filled 2023-05-22: qty 20000

## 2023-05-22 MED ORDER — PHENYLEPHRINE HCL (PRESSORS) 10 MG/ML IV SOLN
INTRAVENOUS | Status: DC | PRN
  Administered 2023-05-22 (×4): 160 ug via INTRAVENOUS

## 2023-05-22 MED ORDER — CHLORHEXIDINE GLUCONATE 0.12 % MT SOLN
15.0000 mL | Freq: Once | OROMUCOSAL | Status: AC
  Administered 2023-05-22: 15 mL via OROMUCOSAL
  Filled 2023-05-22: qty 15

## 2023-05-22 MED ORDER — BUPIVACAINE LIPOSOME 1.3 % IJ SUSP
INTRAMUSCULAR | Status: AC
  Filled 2023-05-22: qty 20

## 2023-05-22 MED ORDER — FENTANYL CITRATE (PF) 100 MCG/2ML IJ SOLN
INTRAMUSCULAR | Status: DC | PRN
  Administered 2023-05-22: 50 ug via INTRAVENOUS

## 2023-05-22 MED ORDER — LIDOCAINE 2% (20 MG/ML) 5 ML SYRINGE
INTRAMUSCULAR | Status: AC
  Filled 2023-05-22: qty 5

## 2023-05-22 MED ORDER — INSULIN ASPART 100 UNIT/ML IJ SOLN: 0.0000 [IU] | INTRAMUSCULAR | Status: DC | PRN

## 2023-05-22 MED ORDER — FENTANYL CITRATE (PF) 250 MCG/5ML IJ SOLN
INTRAMUSCULAR | Status: DC | PRN
  Administered 2023-05-22 (×5): 50 ug via INTRAVENOUS

## 2023-05-22 MED ORDER — PHENYLEPHRINE 80 MCG/ML (10ML) SYRINGE FOR IV PUSH (FOR BLOOD PRESSURE SUPPORT)
PREFILLED_SYRINGE | INTRAVENOUS | Status: AC
  Filled 2023-05-22: qty 10

## 2023-05-22 MED ORDER — ROCURONIUM BROMIDE 10 MG/ML (PF) SYRINGE
PREFILLED_SYRINGE | INTRAVENOUS | Status: AC
  Filled 2023-05-22: qty 10

## 2023-05-22 MED ORDER — LIDOCAINE-EPINEPHRINE 1 %-1:100000 IJ SOLN
INTRAMUSCULAR | Status: AC
  Filled 2023-05-22: qty 1

## 2023-05-22 MED ORDER — PHENYLEPHRINE HCL-NACL 20-0.9 MG/250ML-% IV SOLN
INTRAVENOUS | Status: DC | PRN
  Administered 2023-05-22: 30 ug/min via INTRAVENOUS

## 2023-05-22 MED ORDER — ORAL CARE MOUTH RINSE: 15.0000 mL | Freq: Once | OROMUCOSAL | Status: AC

## 2023-05-22 MED ORDER — PROPOFOL 10 MG/ML IV BOLUS
INTRAVENOUS | Status: AC
Start: 2023-05-22 — End: ?
  Filled 2023-05-22: qty 20

## 2023-05-22 MED ORDER — THROMBIN 5000 UNITS EX SOLR
OROMUCOSAL | Status: DC | PRN
  Administered 2023-05-22: 5 mL

## 2023-05-22 MED ORDER — EPHEDRINE 5 MG/ML INJ
INTRAVENOUS | Status: AC
  Filled 2023-05-22: qty 5

## 2023-05-22 MED ORDER — MICROFIBRILLAR COLL HEMOSTAT EX POWD
CUTANEOUS | Status: AC
  Filled 2023-05-22: qty 1

## 2023-05-22 MED ORDER — OXYCODONE HCL 5 MG PO TABS
5.0000 mg | ORAL_TABLET | Freq: Once | ORAL | Status: AC
  Administered 2023-05-22: 5 mg via ORAL

## 2023-05-22 MED ORDER — ACETAMINOPHEN 500 MG PO TABS: 1000.0000 mg | ORAL_TABLET | Freq: Once | ORAL | Status: DC

## 2023-05-22 MED ORDER — LACTATED RINGERS IV SOLN
INTRAVENOUS | Status: DC
Start: 2023-05-22 — End: 2023-05-22

## 2023-05-22 MED ORDER — FENTANYL CITRATE (PF) 250 MCG/5ML IJ SOLN
INTRAMUSCULAR | Status: AC
  Filled 2023-05-22: qty 5

## 2023-05-22 MED ORDER — LIDOCAINE 2% (20 MG/ML) 5 ML SYRINGE
INTRAMUSCULAR | Status: DC | PRN
  Administered 2023-05-22: 60 mg via INTRAVENOUS

## 2023-05-22 MED ORDER — SUGAMMADEX SODIUM 200 MG/2ML IV SOLN
INTRAVENOUS | Status: DC | PRN
  Administered 2023-05-22: 200 mg via INTRAVENOUS

## 2023-05-22 MED ORDER — SUCCINYLCHOLINE CHLORIDE 200 MG/10ML IV SOSY
PREFILLED_SYRINGE | INTRAVENOUS | Status: DC | PRN
  Administered 2023-05-22: 100 mg via INTRAVENOUS

## 2023-05-22 MED ORDER — CEFAZOLIN SODIUM-DEXTROSE 2-4 GM/100ML-% IV SOLN
2.0000 g | INTRAVENOUS | Status: AC
  Administered 2023-05-22: 2 g via INTRAVENOUS

## 2023-05-22 MED ORDER — BUPIVACAINE HCL (PF) 0.5 % IJ SOLN
INTRAMUSCULAR | Status: AC
  Filled 2023-05-22: qty 30

## 2023-05-22 MED ORDER — ONDANSETRON HCL 4 MG/2ML IJ SOLN
INTRAMUSCULAR | Status: AC
  Filled 2023-05-22: qty 2

## 2023-05-22 MED ORDER — HYDROCODONE-ACETAMINOPHEN 5-325 MG PO TABS: 1.0000 | ORAL_TABLET | Freq: Four times a day (QID) | ORAL | 0 refills | Status: AC | PRN

## 2023-05-22 MED ORDER — ALBUMIN HUMAN 5 % IV SOLN: INTRAVENOUS | Status: DC | PRN

## 2023-05-22 MED ORDER — OXYCODONE HCL 5 MG/5ML PO SOLN: 5.0000 mg | ORAL | Status: DC | PRN

## 2023-05-22 MED ORDER — DEXAMETHASONE SODIUM PHOSPHATE 10 MG/ML IJ SOLN
INTRAMUSCULAR | Status: AC
  Filled 2023-05-22: qty 1

## 2023-05-22 MED ORDER — CEFAZOLIN SODIUM-DEXTROSE 2-4 GM/100ML-% IV SOLN
INTRAVENOUS | Status: AC
  Filled 2023-05-22: qty 100

## 2023-05-22 MED ORDER — LIDOCAINE-EPINEPHRINE 1 %-1:100000 IJ SOLN
INTRAMUSCULAR | Status: DC | PRN
  Administered 2023-05-22: 5 mL

## 2023-05-22 MED ORDER — DOCUSATE SODIUM 100 MG PO CAPS: 100.0000 mg | ORAL_CAPSULE | Freq: Two times a day (BID) | ORAL | 0 refills | Status: AC

## 2023-05-22 MED ORDER — 0.9 % SODIUM CHLORIDE (POUR BTL) OPTIME
TOPICAL | Status: DC | PRN
  Administered 2023-05-22: 1000 mL

## 2023-05-22 MED ORDER — MICROFIBRILLAR COLL HEMOSTAT EX POWD
CUTANEOUS | Status: DC | PRN
  Administered 2023-05-22: 1 g via TOPICAL

## 2023-05-22 MED ORDER — METHOCARBAMOL 500 MG PO TABS: 500.0000 mg | ORAL_TABLET | Freq: Three times a day (TID) | ORAL | 0 refills | Status: AC | PRN

## 2023-05-22 MED ORDER — HYDROMORPHONE HCL 1 MG/ML IJ SOLN: 0.2500 mg | INTRAMUSCULAR | Status: DC | PRN

## 2023-05-22 MED ORDER — METHYLPREDNISOLONE ACETATE 80 MG/ML IJ SUSP
INTRAMUSCULAR | Status: AC
  Filled 2023-05-22: qty 1

## 2023-05-22 MED ORDER — FENTANYL CITRATE (PF) 100 MCG/2ML IJ SOLN
INTRAMUSCULAR | Status: AC
  Filled 2023-05-22: qty 2

## 2023-05-22 MED ORDER — PROPOFOL 10 MG/ML IV BOLUS
INTRAVENOUS | Status: DC | PRN
  Administered 2023-05-22: 110 mg via INTRAVENOUS

## 2023-05-22 MED ORDER — THROMBIN 5000 UNITS EX SOLR
CUTANEOUS | Status: AC
  Filled 2023-05-22: qty 5000

## 2023-05-22 MED ORDER — OXYCODONE HCL 5 MG PO TABS
ORAL_TABLET | ORAL | Status: AC
  Filled 2023-05-22: qty 1

## 2023-05-22 SURGICAL SUPPLY — 51 items
BAG COUNTER SPONGE SURGICOUNT (BAG) ×2 IMPLANT
BUR CARBIDE MATCH 3.0 (BURR) ×2 IMPLANT
CNTNR URN SCR LID CUP LEK RST (MISCELLANEOUS) ×2 IMPLANT
COVER MAYO STAND STRL (DRAPES) ×2 IMPLANT
DERMABOND ADVANCED .7 DNX12 (GAUZE/BANDAGES/DRESSINGS) IMPLANT
DRAIN JACKSON RD 7FR 3/32 (WOUND CARE) IMPLANT
DRAPE C-ARM 42X72 X-RAY (DRAPES) ×2 IMPLANT
DRAPE LAPAROTOMY 100X72X124 (DRAPES) ×2 IMPLANT
DRAPE MICROSCOPE SLANT 54X150 (MISCELLANEOUS) ×2 IMPLANT
DRAPE SURG 17X23 STRL (DRAPES) ×2 IMPLANT
DRSG OPSITE POSTOP 4X6 (GAUZE/BANDAGES/DRESSINGS) IMPLANT
DURAPREP 26ML APPLICATOR (WOUND CARE) ×2 IMPLANT
ELECT BLADE INSULATED 4IN (ELECTROSURGICAL) ×1
ELECT COATED BLADE 2.86 ST (ELECTRODE) ×2 IMPLANT
ELECT REM PT RETURN 9FT ADLT (ELECTROSURGICAL) ×1
ELECTRODE BLADE INSULATED 4IN (ELECTROSURGICAL) ×2 IMPLANT
ELECTRODE REM PT RTRN 9FT ADLT (ELECTROSURGICAL) ×2 IMPLANT
EVACUATOR 1/8 PVC DRAIN (DRAIN) IMPLANT
GAUZE 4X4 16PLY ~~LOC~~+RFID DBL (SPONGE) IMPLANT
GAUZE SPONGE 4X4 12PLY STRL (GAUZE/BANDAGES/DRESSINGS) ×2 IMPLANT
GLOVE BIO SURGEON STRL SZ7 (GLOVE) ×2 IMPLANT
GLOVE BIOGEL PI IND STRL 7.5 (GLOVE) ×2 IMPLANT
GLOVE BIOGEL PI IND STRL 8 (GLOVE) ×2 IMPLANT
GLOVE ECLIPSE 8.0 STRL XLNG CF (GLOVE) ×4 IMPLANT
GOWN STRL REUS W/ TWL LRG LVL3 (GOWN DISPOSABLE) IMPLANT
GOWN STRL REUS W/ TWL XL LVL3 (GOWN DISPOSABLE) ×4 IMPLANT
GOWN STRL REUS W/TWL 2XL LVL3 (GOWN DISPOSABLE) IMPLANT
HEMOSTAT POWDER KIT SURGIFOAM (HEMOSTASIS) ×2 IMPLANT
KIT BASIN OR (CUSTOM PROCEDURE TRAY) ×2 IMPLANT
KIT POSITION SURG JACKSON T1 (MISCELLANEOUS) ×2 IMPLANT
KIT TURNOVER KIT B (KITS) ×2 IMPLANT
MARKER SKIN DUAL TIP RULER LAB (MISCELLANEOUS) ×2 IMPLANT
NDL HYPO 25X1 1.5 SAFETY (NEEDLE) ×2 IMPLANT
NEEDLE HYPO 25X1 1.5 SAFETY (NEEDLE) ×1 IMPLANT
NS IRRIG 1000ML POUR BTL (IV SOLUTION) ×2 IMPLANT
PACK LAMINECTOMY NEURO (CUSTOM PROCEDURE TRAY) ×2 IMPLANT
PAD ARMBOARD 7.5X6 YLW CONV (MISCELLANEOUS) ×6 IMPLANT
PATTIES SURGICAL .5 X.5 (GAUZE/BANDAGES/DRESSINGS) IMPLANT
PATTIES SURGICAL .5 X1 (DISPOSABLE) IMPLANT
PATTIES SURGICAL 1X1 (DISPOSABLE) IMPLANT
SPIKE FLUID TRANSFER (MISCELLANEOUS) ×2 IMPLANT
SPONGE SURGIFOAM ABS GEL SZ50 (HEMOSTASIS) ×2 IMPLANT
SPONGE T-LAP 4X18 ~~LOC~~+RFID (SPONGE) IMPLANT
STAPLER VISISTAT 35W (STAPLE) IMPLANT
SUT VIC AB 0 CT1 18XCR BRD8 (SUTURE) ×2 IMPLANT
SUT VIC AB 2-0 CP2 18 (SUTURE) ×2 IMPLANT
SUT VIC AB 3-0 SH 8-18 (SUTURE) ×2 IMPLANT
SYR 30ML LL (SYRINGE) IMPLANT
TOWEL GREEN STERILE (TOWEL DISPOSABLE) ×2 IMPLANT
TOWEL GREEN STERILE FF (TOWEL DISPOSABLE) ×2 IMPLANT
WATER STERILE IRR 1000ML POUR (IV SOLUTION) ×2 IMPLANT

## 2023-05-22 NOTE — Transfer of Care (Signed)
 Immediate Anesthesia Transfer of Care Note  Patient: Jamie Howard  Procedure(s) Performed: open lumbar laminectomy, medical facetectomies, L45 (Spine Lumbar)  Patient Location: PACU  Anesthesia Type:General  Level of Consciousness: awake, alert , and oriented  Airway & Oxygen Therapy: Patient Spontanous Breathing and Patient connected to face mask oxygen  Post-op Assessment: Report given to RN and Post -op Vital signs reviewed and stable  Post vital signs: Reviewed and stable  Last Vitals:  Vitals Value Taken Time  BP 144/63 05/22/23 1551  Temp    Pulse 79 05/22/23 1600  Resp 19 05/22/23 1600  SpO2 99 % 05/22/23 1600  Vitals shown include unfiled device data.  Last Pain:  Vitals:   05/22/23 1104  PainSc: 0-No pain         Complications: No notable events documented.

## 2023-05-22 NOTE — H&P (Signed)
 Providing Compassionate, Quality Care - Together  NEUROSURGERY HISTORY & PHYSICAL   Jamie Howard is an 83 y.o. male.   Chief Complaint: Bilateral lower extremity neurogenic claudication HPI: This is an 83 year old male with history of progressively worsening bilateral lower extremity numbness tingling and burning when standing and walking.  MRI revealed severe multifactorial lumbar stenosis at L4-5.  He presents today for surgical intervention.  Past Medical History:  Diagnosis Date   CAD (coronary artery disease)    a. s/p remote stent ~2002   Cancer (HCC)    skin cancer removed from right cheek area (under eye)   Diabetes mellitus without complication (HCC)    Dysrhythmia    Complete Heart Block - PPM placed   HLD (hyperlipidemia)    Hypertension    Presence of permanent cardiac pacemaker    Medtronic PPM    Past Surgical History:  Procedure Laterality Date   BACK SURGERY     ruptured disk 1970s-1980s   OTHER SURGICAL HISTORY     stent   TONSILLECTOMY     removed as a teenager    Family History  Problem Relation Age of Onset   Heart attack Other        dad at 67   Diabetes Other        younger brother died complications of DM   Diabetes Other        brother 2, DM 2    Diabetes Other        aunt type 1   Leukemia Other        aunt   Social History:  reports that he quit smoking about 70 years ago. His smoking use included cigarettes. He has never used smokeless tobacco. He reports current alcohol use of about 2.0 standard drinks of alcohol per week. He reports that he does not use drugs.  Allergies: No Known Allergies  Medications Prior to Admission  Medication Sig Dispense Refill   allopurinol (ZYLOPRIM) 100 MG tablet Take 100 mg by mouth daily.     amLODipine (NORVASC) 5 MG tablet Take 5 mg by mouth at bedtime.     aspirin  EC 81 MG tablet Take 81 mg by mouth daily.     atorvastatin (LIPITOR) 40 MG tablet Take 40 mg by mouth daily.     Coenzyme  Q10 (COQ10 PO) Take 300 mg by mouth daily.     cyanocobalamin  (VITAMIN B12) 1000 MCG tablet Take 1,000 mcg by mouth daily.     doxazosin (CARDURA) 8 MG tablet Take 16 mg by mouth at bedtime.     ferrous sulfate 325 (65 FE) MG tablet Take 325 mg by mouth daily with breakfast.     insulin  glargine, 1 Unit Dial, (TOUJEO SOLOSTAR) 300 UNIT/ML Solostar Pen Inject 20 Units into the skin at bedtime.     latanoprost (XALATAN) 0.005 % ophthalmic solution Place 1 drop into both eyes at bedtime.     losartan-hydrochlorothiazide (HYZAAR) 100-25 MG per tablet Take 1 tablet by mouth daily.     magnesium oxide (MAG-OX) 400 (240 Mg) MG tablet Take 800 mg by mouth 2 (two) times daily.     metFORMIN (GLUCOPHAGE-XR) 500 MG 24 hr tablet Take 1,000 mg by mouth 2 (two) times daily.     Multiple Vitamin (MULTIVITAMIN) tablet Take 1 tablet by mouth daily.     vitamin C (ASCORBIC ACID) 250 MG tablet Take 250 mg by mouth daily.     colchicine  0.6 MG tablet Take  1 tablet (0.6 mg total) by mouth 2 (two) times daily. (Patient taking differently: Take 0.6 mg by mouth 2 (two) times daily as needed (gout).) 60 tablet 2    Results for orders placed or performed during the hospital encounter of 05/22/23 (from the past 48 hours)  Glucose, capillary     Status: Abnormal   Collection Time: 05/22/23 10:52 AM  Result Value Ref Range   Glucose-Capillary 139 (H) 70 - 99 mg/dL    Comment: Glucose reference range applies only to samples taken after fasting for at least 8 hours.  Glucose, capillary     Status: Abnormal   Collection Time: 05/22/23 12:54 PM  Result Value Ref Range   Glucose-Capillary 109 (H) 70 - 99 mg/dL    Comment: Glucose reference range applies only to samples taken after fasting for at least 8 hours.   No results found.  ROS All pertinent positives and negatives are listed HPI above  Blood pressure (!) 128/54, pulse 81, temperature 98.1 F (36.7 C), resp. rate 18, height 5' 10.5 (1.791 m), weight 93 kg,  SpO2 99%. Physical Exam  Awake alert oriented x 3, no acute distress PERRLA Cranial nerves II through XII intact  Bilateral upper/lower extremities full-strength throughout Nonlabored breathing Speech is fluent  Assessment/Plan 83 year old male with  L4-5 multifactorial lumbar stenosis, severe with neurogenic claudication  -OR today for L4-5 bilateral decompression.  We discussed all risks, benefits and expected outcomes as well as alternatives to treatment.  Informed consent was obtained and witnessed.  Clearance was obtained.  Thank you for allowing me to participate in this patient's care.  Please do not hesitate to call with questions or concerns.   Lani Meadows, DO Neurosurgeon [facility] [phone]

## 2023-05-22 NOTE — Anesthesia Procedure Notes (Addendum)
 Procedure Name: Intubation Date/Time: 05/22/2023 2:12 PM  Performed by: Jolynn Mage, CRNAPre-anesthesia Checklist: Patient identified, Patient being monitored, Timeout performed, Emergency Drugs available and Suction available Patient Re-evaluated:Patient Re-evaluated prior to induction Oxygen Delivery Method: Circle System Utilized Preoxygenation: Pre-oxygenation with 100% oxygen Induction Type: IV induction Ventilation: Mask ventilation without difficulty Laryngoscope Size: Miller and 2 Grade View: Grade I Tube type: Oral Tube size: 7.5 mm Number of attempts: 1 Airway Equipment and Method: Stylet Placement Confirmation: ETT inserted through vocal cords under direct vision, positive ETCO2 and breath sounds checked- equal and bilateral Secured at: 22 cm Tube secured with: Tape Dental Injury: Teeth and Oropharynx as per pre-operative assessment

## 2023-05-23 ENCOUNTER — Encounter (HOSPITAL_COMMUNITY): Payer: Self-pay | Admitting: Neurological Surgery

## 2023-05-23 NOTE — Anesthesia Postprocedure Evaluation (Signed)
 Anesthesia Post Note  Patient: Author Hatlestad  Procedure(s) Performed: open lumbar laminectomy, medical facetectomies, L45 (Spine Lumbar)     Patient location during evaluation: PACU Anesthesia Type: General Level of consciousness: awake and alert Pain management: pain level controlled Vital Signs Assessment: post-procedure vital signs reviewed and stable Respiratory status: spontaneous breathing, nonlabored ventilation and respiratory function stable Cardiovascular status: blood pressure returned to baseline and stable Postop Assessment: no apparent nausea or vomiting Anesthetic complications: no   No notable events documented.  Last Vitals:  Vitals:   05/22/23 1615 05/22/23 1630  BP: 137/60 (!) 143/61  Pulse: 79 87  Resp: 13 19  Temp:  36.7 C  SpO2: 96% 95%    Last Pain:  Vitals:   05/22/23 1630  PainSc: 3                  Keeley Sussman,W. EDMOND

## 2023-05-28 NOTE — Op Note (Signed)
  Providing Compassionate, Quality Care - Together   Date of service: 05/22/2023   PREOP DIAGNOSIS:  L4-5 lumbar stenosis with neurogenic claudication   POSTOP DIAGNOSIS: Same   PROCEDURE: Open bilateral L4-5 laminectomy, medial facetectomies for decompression neural elements Intraoperative use of microscope for microdissection Intraoperative use of fluoroscopy   SURGEON: Dr. Kendell Bane C. Lyrik Buresh, DO   ASSISTANT: Dr. Coletta Memos, MD; Patrici Ranks, PA   ANESTHESIA: General Endotracheal   EBL: 25 cc   SPECIMENS: None   DRAINS: None   COMPLICATIONS: None   CONDITION: Hemodynamically stable   HISTORY: Jamie Howard is a 83 y.o. male had complaints of progressive lower extremity neurogenic claudication.  Workup revealed multifactorial severe lumbar stenosis at L4-5, he failed multiple conservative measures and therefore offered him surgical intervention in the form of an L4-5 laminectomy, medial facetectomies for decompression.  We discussed all risks, benefits and expected outcomes as well as alternatives to treatment.  Informed consent was obtained and witnessed.   PROCEDURE IN DETAIL: The patient was brought to the operating room. After induction of general anesthesia, the patient was positioned on the operative table in the prone position. All pressure points were meticulously padded. Skin incision was then marked out and prepped and draped in the usual sterile fashion.   Using a 10 blade, midline incision was created over the L4, L5 spinous processes.  Using Bovie electrocautery soft tissue dissection was performed down to the lumbodorsal fascia.  Subperiosteal dissection was performed bilaterally with Bovie electrocautery exposing the L4, L5 lamina bilaterally and medial L4-5 facets.  Deep retractors placed in the wound.  Lateral fluoroscopy confirmed the appropriate level.  The microscope was sterilely draped and brought into the field.  Using Leksell rongeur, the spinous  process of L4 were removed down to the lamina.  Using a high-speed drill, the L4 lamina was removed bilaterally to the lateral recess down to the ligamentum flavum and superiorly up to the ligamentous attachment bilaterally.  Using high-speed drill, a partial bilateral facetectomy was performed down to the ligamentum flavum.  The superior portion of the L5 lamina was then removed to the epidural space with the high speed drill. The ligamentum flavum was then gently dissected from the epidural space and resected with a series of Kerrison rongeurs to the lateral recess bilaterally.  Using a ball-tipped probe, bilateral lateral recesses appeared decompressed.  The bilateral lateral recess were explored again with a ball-tipped probe and noted to be appropriately decompressed.  The thecal sac was pulsatile.  Bilateral foramen were also palpated with a ball-tipped probe and noted to be adequately decompressed.    A mixture of Depo-Medrol and fentanyl was placed in the epidural space with Avitene. Deep retractor was taken out of the wound.  Hemostasis was achieved with bipolar cautery and the soft tissues.  The wound was closed in layers with 0 Vicryl sutures for muscle and fascia.  Dermis was closed with 2-0 and 3-0 Vicryl sutures.  Skin was closed with skin glue.  Sterile dressing was applied.   At the end of the case all sponge, needle, and instrument counts were correct. The patient was then transferred to the stretcher, extubated, and taken to the post-anesthesia care unit in stable hemodynamic condition.

## 2023-06-11 DIAGNOSIS — M5489 Other dorsalgia: Secondary | ICD-10-CM | POA: Diagnosis not present

## 2023-06-17 DIAGNOSIS — Z23 Encounter for immunization: Secondary | ICD-10-CM | POA: Diagnosis not present

## 2023-06-17 DIAGNOSIS — E1159 Type 2 diabetes mellitus with other circulatory complications: Secondary | ICD-10-CM | POA: Diagnosis not present

## 2023-06-17 DIAGNOSIS — I2510 Atherosclerotic heart disease of native coronary artery without angina pectoris: Secondary | ICD-10-CM | POA: Diagnosis not present

## 2023-06-17 DIAGNOSIS — N182 Chronic kidney disease, stage 2 (mild): Secondary | ICD-10-CM | POA: Diagnosis not present

## 2023-06-17 DIAGNOSIS — E7800 Pure hypercholesterolemia, unspecified: Secondary | ICD-10-CM | POA: Diagnosis not present

## 2023-06-17 DIAGNOSIS — E538 Deficiency of other specified B group vitamins: Secondary | ICD-10-CM | POA: Diagnosis not present

## 2023-06-17 DIAGNOSIS — Z950 Presence of cardiac pacemaker: Secondary | ICD-10-CM | POA: Diagnosis not present

## 2023-06-17 DIAGNOSIS — R001 Bradycardia, unspecified: Secondary | ICD-10-CM | POA: Diagnosis not present

## 2023-06-17 DIAGNOSIS — M109 Gout, unspecified: Secondary | ICD-10-CM | POA: Diagnosis not present

## 2023-06-17 DIAGNOSIS — Z139 Encounter for screening, unspecified: Secondary | ICD-10-CM | POA: Diagnosis not present

## 2023-06-17 DIAGNOSIS — G4733 Obstructive sleep apnea (adult) (pediatric): Secondary | ICD-10-CM | POA: Diagnosis not present

## 2023-06-17 DIAGNOSIS — I10 Essential (primary) hypertension: Secondary | ICD-10-CM | POA: Diagnosis not present

## 2023-06-19 DIAGNOSIS — M5489 Other dorsalgia: Secondary | ICD-10-CM | POA: Diagnosis not present

## 2023-06-22 DIAGNOSIS — D225 Melanocytic nevi of trunk: Secondary | ICD-10-CM | POA: Diagnosis not present

## 2023-06-22 DIAGNOSIS — L570 Actinic keratosis: Secondary | ICD-10-CM | POA: Diagnosis not present

## 2023-06-22 DIAGNOSIS — L814 Other melanin hyperpigmentation: Secondary | ICD-10-CM | POA: Diagnosis not present

## 2023-06-22 DIAGNOSIS — L821 Other seborrheic keratosis: Secondary | ICD-10-CM | POA: Diagnosis not present

## 2023-06-22 DIAGNOSIS — Z85820 Personal history of malignant melanoma of skin: Secondary | ICD-10-CM | POA: Diagnosis not present

## 2023-06-24 DIAGNOSIS — M5489 Other dorsalgia: Secondary | ICD-10-CM | POA: Diagnosis not present

## 2023-07-01 DIAGNOSIS — M5489 Other dorsalgia: Secondary | ICD-10-CM | POA: Diagnosis not present

## 2023-07-02 DIAGNOSIS — Z950 Presence of cardiac pacemaker: Secondary | ICD-10-CM | POA: Diagnosis not present

## 2023-07-08 DIAGNOSIS — I10 Essential (primary) hypertension: Secondary | ICD-10-CM | POA: Diagnosis not present

## 2023-07-08 DIAGNOSIS — Z45018 Encounter for adjustment and management of other part of cardiac pacemaker: Secondary | ICD-10-CM | POA: Diagnosis not present

## 2023-07-08 DIAGNOSIS — M5489 Other dorsalgia: Secondary | ICD-10-CM | POA: Diagnosis not present

## 2023-07-08 DIAGNOSIS — Z8679 Personal history of other diseases of the circulatory system: Secondary | ICD-10-CM | POA: Diagnosis not present

## 2023-07-08 DIAGNOSIS — I480 Paroxysmal atrial fibrillation: Secondary | ICD-10-CM | POA: Diagnosis not present

## 2023-07-08 DIAGNOSIS — I2510 Atherosclerotic heart disease of native coronary artery without angina pectoris: Secondary | ICD-10-CM | POA: Diagnosis not present

## 2023-07-08 DIAGNOSIS — R0989 Other specified symptoms and signs involving the circulatory and respiratory systems: Secondary | ICD-10-CM | POA: Diagnosis not present

## 2023-07-13 DIAGNOSIS — M5489 Other dorsalgia: Secondary | ICD-10-CM | POA: Diagnosis not present

## 2023-07-22 DIAGNOSIS — Z45018 Encounter for adjustment and management of other part of cardiac pacemaker: Secondary | ICD-10-CM | POA: Diagnosis not present

## 2023-07-22 DIAGNOSIS — M5489 Other dorsalgia: Secondary | ICD-10-CM | POA: Diagnosis not present

## 2023-07-29 DIAGNOSIS — C4492 Squamous cell carcinoma of skin, unspecified: Secondary | ICD-10-CM | POA: Diagnosis not present

## 2023-07-29 DIAGNOSIS — J324 Chronic pansinusitis: Secondary | ICD-10-CM | POA: Diagnosis not present

## 2023-07-29 DIAGNOSIS — Z8589 Personal history of malignant neoplasm of other organs and systems: Secondary | ICD-10-CM | POA: Diagnosis not present

## 2023-07-29 DIAGNOSIS — Z08 Encounter for follow-up examination after completed treatment for malignant neoplasm: Secondary | ICD-10-CM | POA: Diagnosis not present

## 2023-07-30 DIAGNOSIS — I6523 Occlusion and stenosis of bilateral carotid arteries: Secondary | ICD-10-CM | POA: Diagnosis not present

## 2023-07-30 DIAGNOSIS — I480 Paroxysmal atrial fibrillation: Secondary | ICD-10-CM | POA: Diagnosis not present

## 2023-07-30 DIAGNOSIS — I2510 Atherosclerotic heart disease of native coronary artery without angina pectoris: Secondary | ICD-10-CM | POA: Diagnosis not present

## 2023-07-30 DIAGNOSIS — E119 Type 2 diabetes mellitus without complications: Secondary | ICD-10-CM | POA: Diagnosis not present

## 2023-07-30 DIAGNOSIS — I10 Essential (primary) hypertension: Secondary | ICD-10-CM | POA: Diagnosis not present

## 2023-07-31 DIAGNOSIS — M5489 Other dorsalgia: Secondary | ICD-10-CM | POA: Diagnosis not present

## 2023-08-05 DIAGNOSIS — M5489 Other dorsalgia: Secondary | ICD-10-CM | POA: Diagnosis not present

## 2023-08-06 DIAGNOSIS — M7662 Achilles tendinitis, left leg: Secondary | ICD-10-CM | POA: Diagnosis not present

## 2023-08-06 DIAGNOSIS — M1030 Gout due to renal impairment, unspecified site: Secondary | ICD-10-CM | POA: Diagnosis not present

## 2023-08-14 DIAGNOSIS — M5489 Other dorsalgia: Secondary | ICD-10-CM | POA: Diagnosis not present

## 2023-08-21 DIAGNOSIS — R001 Bradycardia, unspecified: Secondary | ICD-10-CM | POA: Diagnosis not present

## 2023-08-21 DIAGNOSIS — R531 Weakness: Secondary | ICD-10-CM | POA: Diagnosis not present

## 2023-08-21 DIAGNOSIS — U071 COVID-19: Secondary | ICD-10-CM | POA: Diagnosis not present

## 2023-08-21 DIAGNOSIS — J101 Influenza due to other identified influenza virus with other respiratory manifestations: Secondary | ICD-10-CM | POA: Diagnosis not present

## 2023-08-31 DIAGNOSIS — M109 Gout, unspecified: Secondary | ICD-10-CM | POA: Diagnosis not present

## 2023-08-31 DIAGNOSIS — U071 COVID-19: Secondary | ICD-10-CM | POA: Diagnosis not present

## 2023-08-31 DIAGNOSIS — M545 Low back pain, unspecified: Secondary | ICD-10-CM | POA: Diagnosis not present

## 2023-08-31 DIAGNOSIS — J101 Influenza due to other identified influenza virus with other respiratory manifestations: Secondary | ICD-10-CM | POA: Diagnosis not present

## 2023-09-24 DIAGNOSIS — I10 Essential (primary) hypertension: Secondary | ICD-10-CM | POA: Diagnosis not present

## 2023-09-24 DIAGNOSIS — N182 Chronic kidney disease, stage 2 (mild): Secondary | ICD-10-CM | POA: Diagnosis not present

## 2023-09-24 DIAGNOSIS — Z950 Presence of cardiac pacemaker: Secondary | ICD-10-CM | POA: Diagnosis not present

## 2023-09-24 DIAGNOSIS — I2510 Atherosclerotic heart disease of native coronary artery without angina pectoris: Secondary | ICD-10-CM | POA: Diagnosis not present

## 2023-09-24 DIAGNOSIS — M109 Gout, unspecified: Secondary | ICD-10-CM | POA: Diagnosis not present

## 2023-09-24 DIAGNOSIS — R609 Edema, unspecified: Secondary | ICD-10-CM | POA: Diagnosis not present

## 2023-09-24 DIAGNOSIS — E7800 Pure hypercholesterolemia, unspecified: Secondary | ICD-10-CM | POA: Diagnosis not present

## 2023-09-24 DIAGNOSIS — M545 Low back pain, unspecified: Secondary | ICD-10-CM | POA: Diagnosis not present

## 2023-09-24 DIAGNOSIS — E1159 Type 2 diabetes mellitus with other circulatory complications: Secondary | ICD-10-CM | POA: Diagnosis not present

## 2023-09-24 DIAGNOSIS — G4733 Obstructive sleep apnea (adult) (pediatric): Secondary | ICD-10-CM | POA: Diagnosis not present

## 2023-09-30 DIAGNOSIS — Z8589 Personal history of malignant neoplasm of other organs and systems: Secondary | ICD-10-CM | POA: Diagnosis not present

## 2023-09-30 DIAGNOSIS — C4492 Squamous cell carcinoma of skin, unspecified: Secondary | ICD-10-CM | POA: Diagnosis not present

## 2023-09-30 DIAGNOSIS — Z85828 Personal history of other malignant neoplasm of skin: Secondary | ICD-10-CM | POA: Diagnosis not present

## 2023-11-18 DIAGNOSIS — Z794 Long term (current) use of insulin: Secondary | ICD-10-CM | POA: Diagnosis not present

## 2023-11-18 DIAGNOSIS — H0100A Unspecified blepharitis right eye, upper and lower eyelids: Secondary | ICD-10-CM | POA: Diagnosis not present

## 2023-11-18 DIAGNOSIS — H40053 Ocular hypertension, bilateral: Secondary | ICD-10-CM | POA: Diagnosis not present

## 2023-11-18 DIAGNOSIS — H52203 Unspecified astigmatism, bilateral: Secondary | ICD-10-CM | POA: Diagnosis not present

## 2023-11-18 DIAGNOSIS — E119 Type 2 diabetes mellitus without complications: Secondary | ICD-10-CM | POA: Diagnosis not present

## 2023-11-18 DIAGNOSIS — H0288B Meibomian gland dysfunction left eye, upper and lower eyelids: Secondary | ICD-10-CM | POA: Diagnosis not present

## 2023-11-18 DIAGNOSIS — H18413 Arcus senilis, bilateral: Secondary | ICD-10-CM | POA: Diagnosis not present

## 2023-11-18 DIAGNOSIS — H0220C Unspecified lagophthalmos, bilateral, upper and lower eyelids: Secondary | ICD-10-CM | POA: Diagnosis not present

## 2023-11-18 DIAGNOSIS — H0288A Meibomian gland dysfunction right eye, upper and lower eyelids: Secondary | ICD-10-CM | POA: Diagnosis not present

## 2023-11-18 DIAGNOSIS — H11153 Pinguecula, bilateral: Secondary | ICD-10-CM | POA: Diagnosis not present

## 2023-11-18 DIAGNOSIS — H0100B Unspecified blepharitis left eye, upper and lower eyelids: Secondary | ICD-10-CM | POA: Diagnosis not present

## 2023-11-18 DIAGNOSIS — Z961 Presence of intraocular lens: Secondary | ICD-10-CM | POA: Diagnosis not present

## 2023-12-21 DIAGNOSIS — L814 Other melanin hyperpigmentation: Secondary | ICD-10-CM | POA: Diagnosis not present

## 2023-12-21 DIAGNOSIS — D225 Melanocytic nevi of trunk: Secondary | ICD-10-CM | POA: Diagnosis not present

## 2023-12-21 DIAGNOSIS — L570 Actinic keratosis: Secondary | ICD-10-CM | POA: Diagnosis not present

## 2023-12-21 DIAGNOSIS — L821 Other seborrheic keratosis: Secondary | ICD-10-CM | POA: Diagnosis not present

## 2023-12-21 DIAGNOSIS — Z85820 Personal history of malignant melanoma of skin: Secondary | ICD-10-CM | POA: Diagnosis not present

## 2023-12-21 DIAGNOSIS — L820 Inflamed seborrheic keratosis: Secondary | ICD-10-CM | POA: Diagnosis not present

## 2023-12-22 DIAGNOSIS — M9904 Segmental and somatic dysfunction of sacral region: Secondary | ICD-10-CM | POA: Diagnosis not present

## 2023-12-22 DIAGNOSIS — M48062 Spinal stenosis, lumbar region with neurogenic claudication: Secondary | ICD-10-CM | POA: Diagnosis not present

## 2023-12-22 DIAGNOSIS — M9902 Segmental and somatic dysfunction of thoracic region: Secondary | ICD-10-CM | POA: Diagnosis not present

## 2023-12-22 DIAGNOSIS — M9903 Segmental and somatic dysfunction of lumbar region: Secondary | ICD-10-CM | POA: Diagnosis not present

## 2023-12-22 DIAGNOSIS — M9901 Segmental and somatic dysfunction of cervical region: Secondary | ICD-10-CM | POA: Diagnosis not present

## 2023-12-22 DIAGNOSIS — M6080 Other myositis, unspecified site: Secondary | ICD-10-CM | POA: Diagnosis not present

## 2023-12-22 DIAGNOSIS — M9905 Segmental and somatic dysfunction of pelvic region: Secondary | ICD-10-CM | POA: Diagnosis not present

## 2023-12-23 DIAGNOSIS — M9903 Segmental and somatic dysfunction of lumbar region: Secondary | ICD-10-CM | POA: Diagnosis not present

## 2023-12-23 DIAGNOSIS — M9904 Segmental and somatic dysfunction of sacral region: Secondary | ICD-10-CM | POA: Diagnosis not present

## 2023-12-23 DIAGNOSIS — M9902 Segmental and somatic dysfunction of thoracic region: Secondary | ICD-10-CM | POA: Diagnosis not present

## 2023-12-23 DIAGNOSIS — M9901 Segmental and somatic dysfunction of cervical region: Secondary | ICD-10-CM | POA: Diagnosis not present

## 2023-12-23 DIAGNOSIS — M6080 Other myositis, unspecified site: Secondary | ICD-10-CM | POA: Diagnosis not present

## 2023-12-23 DIAGNOSIS — M48062 Spinal stenosis, lumbar region with neurogenic claudication: Secondary | ICD-10-CM | POA: Diagnosis not present

## 2023-12-23 DIAGNOSIS — M9905 Segmental and somatic dysfunction of pelvic region: Secondary | ICD-10-CM | POA: Diagnosis not present

## 2023-12-25 DIAGNOSIS — M9902 Segmental and somatic dysfunction of thoracic region: Secondary | ICD-10-CM | POA: Diagnosis not present

## 2023-12-25 DIAGNOSIS — M6080 Other myositis, unspecified site: Secondary | ICD-10-CM | POA: Diagnosis not present

## 2023-12-25 DIAGNOSIS — M48062 Spinal stenosis, lumbar region with neurogenic claudication: Secondary | ICD-10-CM | POA: Diagnosis not present

## 2023-12-25 DIAGNOSIS — M9901 Segmental and somatic dysfunction of cervical region: Secondary | ICD-10-CM | POA: Diagnosis not present

## 2023-12-25 DIAGNOSIS — M9905 Segmental and somatic dysfunction of pelvic region: Secondary | ICD-10-CM | POA: Diagnosis not present

## 2023-12-25 DIAGNOSIS — M9904 Segmental and somatic dysfunction of sacral region: Secondary | ICD-10-CM | POA: Diagnosis not present

## 2023-12-25 DIAGNOSIS — M9903 Segmental and somatic dysfunction of lumbar region: Secondary | ICD-10-CM | POA: Diagnosis not present

## 2023-12-28 DIAGNOSIS — M9902 Segmental and somatic dysfunction of thoracic region: Secondary | ICD-10-CM | POA: Diagnosis not present

## 2023-12-28 DIAGNOSIS — M9904 Segmental and somatic dysfunction of sacral region: Secondary | ICD-10-CM | POA: Diagnosis not present

## 2023-12-28 DIAGNOSIS — M545 Low back pain, unspecified: Secondary | ICD-10-CM | POA: Diagnosis not present

## 2023-12-28 DIAGNOSIS — N182 Chronic kidney disease, stage 2 (mild): Secondary | ICD-10-CM | POA: Diagnosis not present

## 2023-12-28 DIAGNOSIS — I2510 Atherosclerotic heart disease of native coronary artery without angina pectoris: Secondary | ICD-10-CM | POA: Diagnosis not present

## 2023-12-28 DIAGNOSIS — I10 Essential (primary) hypertension: Secondary | ICD-10-CM | POA: Diagnosis not present

## 2023-12-28 DIAGNOSIS — G4733 Obstructive sleep apnea (adult) (pediatric): Secondary | ICD-10-CM | POA: Diagnosis not present

## 2023-12-28 DIAGNOSIS — R609 Edema, unspecified: Secondary | ICD-10-CM | POA: Diagnosis not present

## 2023-12-28 DIAGNOSIS — M9905 Segmental and somatic dysfunction of pelvic region: Secondary | ICD-10-CM | POA: Diagnosis not present

## 2023-12-28 DIAGNOSIS — Z1331 Encounter for screening for depression: Secondary | ICD-10-CM | POA: Diagnosis not present

## 2023-12-28 DIAGNOSIS — Z950 Presence of cardiac pacemaker: Secondary | ICD-10-CM | POA: Diagnosis not present

## 2023-12-28 DIAGNOSIS — M9903 Segmental and somatic dysfunction of lumbar region: Secondary | ICD-10-CM | POA: Diagnosis not present

## 2023-12-28 DIAGNOSIS — E7800 Pure hypercholesterolemia, unspecified: Secondary | ICD-10-CM | POA: Diagnosis not present

## 2023-12-28 DIAGNOSIS — M6080 Other myositis, unspecified site: Secondary | ICD-10-CM | POA: Diagnosis not present

## 2023-12-28 DIAGNOSIS — M9901 Segmental and somatic dysfunction of cervical region: Secondary | ICD-10-CM | POA: Diagnosis not present

## 2023-12-28 DIAGNOSIS — M48062 Spinal stenosis, lumbar region with neurogenic claudication: Secondary | ICD-10-CM | POA: Diagnosis not present

## 2023-12-28 DIAGNOSIS — M109 Gout, unspecified: Secondary | ICD-10-CM | POA: Diagnosis not present

## 2023-12-28 DIAGNOSIS — E1159 Type 2 diabetes mellitus with other circulatory complications: Secondary | ICD-10-CM | POA: Diagnosis not present

## 2023-12-30 DIAGNOSIS — M48062 Spinal stenosis, lumbar region with neurogenic claudication: Secondary | ICD-10-CM | POA: Diagnosis not present

## 2023-12-30 DIAGNOSIS — M6080 Other myositis, unspecified site: Secondary | ICD-10-CM | POA: Diagnosis not present

## 2023-12-30 DIAGNOSIS — M9905 Segmental and somatic dysfunction of pelvic region: Secondary | ICD-10-CM | POA: Diagnosis not present

## 2023-12-30 DIAGNOSIS — M9904 Segmental and somatic dysfunction of sacral region: Secondary | ICD-10-CM | POA: Diagnosis not present

## 2023-12-30 DIAGNOSIS — M9902 Segmental and somatic dysfunction of thoracic region: Secondary | ICD-10-CM | POA: Diagnosis not present

## 2023-12-30 DIAGNOSIS — M9903 Segmental and somatic dysfunction of lumbar region: Secondary | ICD-10-CM | POA: Diagnosis not present

## 2023-12-30 DIAGNOSIS — M9901 Segmental and somatic dysfunction of cervical region: Secondary | ICD-10-CM | POA: Diagnosis not present

## 2024-01-01 DIAGNOSIS — M48062 Spinal stenosis, lumbar region with neurogenic claudication: Secondary | ICD-10-CM | POA: Diagnosis not present

## 2024-01-01 DIAGNOSIS — M9902 Segmental and somatic dysfunction of thoracic region: Secondary | ICD-10-CM | POA: Diagnosis not present

## 2024-01-01 DIAGNOSIS — M9901 Segmental and somatic dysfunction of cervical region: Secondary | ICD-10-CM | POA: Diagnosis not present

## 2024-01-01 DIAGNOSIS — M6080 Other myositis, unspecified site: Secondary | ICD-10-CM | POA: Diagnosis not present

## 2024-01-01 DIAGNOSIS — M9903 Segmental and somatic dysfunction of lumbar region: Secondary | ICD-10-CM | POA: Diagnosis not present

## 2024-01-01 DIAGNOSIS — M9905 Segmental and somatic dysfunction of pelvic region: Secondary | ICD-10-CM | POA: Diagnosis not present

## 2024-01-01 DIAGNOSIS — M9904 Segmental and somatic dysfunction of sacral region: Secondary | ICD-10-CM | POA: Diagnosis not present

## 2024-01-07 DIAGNOSIS — M5489 Other dorsalgia: Secondary | ICD-10-CM | POA: Diagnosis not present

## 2024-01-07 DIAGNOSIS — R2689 Other abnormalities of gait and mobility: Secondary | ICD-10-CM | POA: Diagnosis not present

## 2024-01-07 DIAGNOSIS — M2560 Stiffness of unspecified joint, not elsewhere classified: Secondary | ICD-10-CM | POA: Diagnosis not present

## 2024-01-13 DIAGNOSIS — Z950 Presence of cardiac pacemaker: Secondary | ICD-10-CM | POA: Diagnosis not present

## 2024-01-14 DIAGNOSIS — R2689 Other abnormalities of gait and mobility: Secondary | ICD-10-CM | POA: Diagnosis not present

## 2024-01-14 DIAGNOSIS — M2560 Stiffness of unspecified joint, not elsewhere classified: Secondary | ICD-10-CM | POA: Diagnosis not present

## 2024-01-14 DIAGNOSIS — M5489 Other dorsalgia: Secondary | ICD-10-CM | POA: Diagnosis not present

## 2024-03-09 DIAGNOSIS — M47816 Spondylosis without myelopathy or radiculopathy, lumbar region: Secondary | ICD-10-CM | POA: Diagnosis not present

## 2024-03-09 DIAGNOSIS — Z1330 Encounter for screening examination for mental health and behavioral disorders, unspecified: Secondary | ICD-10-CM | POA: Diagnosis not present

## 2024-03-09 DIAGNOSIS — M545 Low back pain, unspecified: Secondary | ICD-10-CM | POA: Diagnosis not present

## 2024-03-09 DIAGNOSIS — Z9889 Other specified postprocedural states: Secondary | ICD-10-CM | POA: Diagnosis not present

## 2024-03-09 DIAGNOSIS — G8929 Other chronic pain: Secondary | ICD-10-CM | POA: Diagnosis not present

## 2024-03-09 DIAGNOSIS — M48061 Spinal stenosis, lumbar region without neurogenic claudication: Secondary | ICD-10-CM | POA: Diagnosis not present

## 2024-03-30 DIAGNOSIS — I10 Essential (primary) hypertension: Secondary | ICD-10-CM | POA: Diagnosis not present

## 2024-03-30 DIAGNOSIS — R609 Edema, unspecified: Secondary | ICD-10-CM | POA: Diagnosis not present

## 2024-03-30 DIAGNOSIS — I2510 Atherosclerotic heart disease of native coronary artery without angina pectoris: Secondary | ICD-10-CM | POA: Diagnosis not present

## 2024-03-30 DIAGNOSIS — N182 Chronic kidney disease, stage 2 (mild): Secondary | ICD-10-CM | POA: Diagnosis not present

## 2024-03-30 DIAGNOSIS — M545 Low back pain, unspecified: Secondary | ICD-10-CM | POA: Diagnosis not present

## 2024-03-30 DIAGNOSIS — E7800 Pure hypercholesterolemia, unspecified: Secondary | ICD-10-CM | POA: Diagnosis not present

## 2024-03-30 DIAGNOSIS — Z950 Presence of cardiac pacemaker: Secondary | ICD-10-CM | POA: Diagnosis not present

## 2024-03-30 DIAGNOSIS — G4733 Obstructive sleep apnea (adult) (pediatric): Secondary | ICD-10-CM | POA: Diagnosis not present

## 2024-03-30 DIAGNOSIS — E1159 Type 2 diabetes mellitus with other circulatory complications: Secondary | ICD-10-CM | POA: Diagnosis not present

## 2024-03-30 DIAGNOSIS — M109 Gout, unspecified: Secondary | ICD-10-CM | POA: Diagnosis not present

## 2024-04-06 DIAGNOSIS — Z8589 Personal history of malignant neoplasm of other organs and systems: Secondary | ICD-10-CM | POA: Diagnosis not present

## 2024-04-06 DIAGNOSIS — C4492 Squamous cell carcinoma of skin, unspecified: Secondary | ICD-10-CM | POA: Diagnosis not present

## 2024-04-14 DIAGNOSIS — M47816 Spondylosis without myelopathy or radiculopathy, lumbar region: Secondary | ICD-10-CM | POA: Diagnosis not present

## 2024-04-14 DIAGNOSIS — M791 Myalgia, unspecified site: Secondary | ICD-10-CM | POA: Diagnosis not present

## 2024-04-14 DIAGNOSIS — M47812 Spondylosis without myelopathy or radiculopathy, cervical region: Secondary | ICD-10-CM | POA: Diagnosis not present

## 2024-04-15 DIAGNOSIS — Z950 Presence of cardiac pacemaker: Secondary | ICD-10-CM | POA: Diagnosis not present

## 2024-04-22 DIAGNOSIS — N182 Chronic kidney disease, stage 2 (mild): Secondary | ICD-10-CM | POA: Diagnosis not present

## 2024-04-22 DIAGNOSIS — I10 Essential (primary) hypertension: Secondary | ICD-10-CM | POA: Diagnosis not present

## 2024-04-22 DIAGNOSIS — D649 Anemia, unspecified: Secondary | ICD-10-CM | POA: Diagnosis not present
